# Patient Record
Sex: Female | Born: 1970 | Race: Black or African American | Hispanic: No | Marital: Single | Smoking: Never smoker
Health system: Southern US, Community
[De-identification: ages and names within clinical notes are randomized; demographics above are authoritative.]

## PROBLEM LIST (undated history)

## (undated) DIAGNOSIS — K219 Gastro-esophageal reflux disease without esophagitis: Secondary | ICD-10-CM

## (undated) DIAGNOSIS — N189 Chronic kidney disease, unspecified: Secondary | ICD-10-CM

## (undated) DIAGNOSIS — Z8489 Family history of other specified conditions: Secondary | ICD-10-CM

## (undated) DIAGNOSIS — E1143 Type 2 diabetes mellitus with diabetic autonomic (poly)neuropathy: Secondary | ICD-10-CM

## (undated) DIAGNOSIS — I10 Essential (primary) hypertension: Secondary | ICD-10-CM

## (undated) DIAGNOSIS — E119 Type 2 diabetes mellitus without complications: Secondary | ICD-10-CM

## (undated) DIAGNOSIS — K3184 Gastroparesis: Secondary | ICD-10-CM

## (undated) HISTORY — PX: NO PAST SURGERIES: SHX2092

---

## 2007-04-24 ENCOUNTER — Ambulatory Visit: Payer: Self-pay | Admitting: Cardiology

## 2016-07-22 ENCOUNTER — Ambulatory Visit (HOSPITAL_COMMUNITY)
Admission: RE | Admit: 2016-07-22 | Discharge: 2016-07-22 | Disposition: A | Discharge status: Home / Self Care | Payer: Medicare Other | Source: Ambulatory Visit | Attending: Neurology | Admitting: Neurology

## 2016-07-22 ENCOUNTER — Other Ambulatory Visit (HOSPITAL_COMMUNITY): Payer: Self-pay | Admitting: Neurology

## 2016-07-22 DIAGNOSIS — M1711 Unilateral primary osteoarthritis, right knee: Secondary | ICD-10-CM | POA: Insufficient documentation

## 2016-07-22 DIAGNOSIS — M25561 Pain in right knee: Secondary | ICD-10-CM | POA: Insufficient documentation

## 2016-07-22 DIAGNOSIS — G8929 Other chronic pain: Secondary | ICD-10-CM | POA: Insufficient documentation

## 2017-03-11 ENCOUNTER — Inpatient Hospital Stay (HOSPITAL_COMMUNITY): Payer: Medicare Other

## 2017-03-11 ENCOUNTER — Encounter (HOSPITAL_COMMUNITY): Payer: Self-pay | Admitting: Emergency Medicine

## 2017-03-11 ENCOUNTER — Inpatient Hospital Stay (HOSPITAL_COMMUNITY)
Admission: EM | Admit: 2017-03-11 | Discharge: 2017-03-15 | DRG: 682 | Disposition: A | Discharge status: Home / Self Care | Payer: Medicare Other | Attending: Internal Medicine | Admitting: Internal Medicine

## 2017-03-11 ENCOUNTER — Emergency Department (HOSPITAL_COMMUNITY): Payer: Medicare Other

## 2017-03-11 DIAGNOSIS — R0602 Shortness of breath: Secondary | ICD-10-CM | POA: Diagnosis not present

## 2017-03-11 DIAGNOSIS — E1140 Type 2 diabetes mellitus with diabetic neuropathy, unspecified: Secondary | ICD-10-CM | POA: Diagnosis present

## 2017-03-11 DIAGNOSIS — N189 Chronic kidney disease, unspecified: Secondary | ICD-10-CM | POA: Diagnosis present

## 2017-03-11 DIAGNOSIS — E872 Acidosis: Secondary | ICD-10-CM | POA: Diagnosis present

## 2017-03-11 DIAGNOSIS — E669 Obesity, unspecified: Secondary | ICD-10-CM | POA: Diagnosis present

## 2017-03-11 DIAGNOSIS — I129 Hypertensive chronic kidney disease with stage 1 through stage 4 chronic kidney disease, or unspecified chronic kidney disease: Secondary | ICD-10-CM | POA: Diagnosis present

## 2017-03-11 DIAGNOSIS — D638 Anemia in other chronic diseases classified elsewhere: Secondary | ICD-10-CM | POA: Diagnosis present

## 2017-03-11 DIAGNOSIS — N179 Acute kidney failure, unspecified: Secondary | ICD-10-CM | POA: Diagnosis present

## 2017-03-11 DIAGNOSIS — D649 Anemia, unspecified: Secondary | ICD-10-CM | POA: Diagnosis present

## 2017-03-11 DIAGNOSIS — N170 Acute kidney failure with tubular necrosis: Principal | ICD-10-CM | POA: Diagnosis present

## 2017-03-11 DIAGNOSIS — E1122 Type 2 diabetes mellitus with diabetic chronic kidney disease: Secondary | ICD-10-CM | POA: Diagnosis present

## 2017-03-11 DIAGNOSIS — E8770 Fluid overload, unspecified: Secondary | ICD-10-CM | POA: Diagnosis present

## 2017-03-11 DIAGNOSIS — J9811 Atelectasis: Secondary | ICD-10-CM | POA: Diagnosis present

## 2017-03-11 DIAGNOSIS — I360 Nonrheumatic tricuspid (valve) stenosis: Secondary | ICD-10-CM | POA: Diagnosis not present

## 2017-03-11 DIAGNOSIS — R0600 Dyspnea, unspecified: Secondary | ICD-10-CM | POA: Diagnosis present

## 2017-03-11 DIAGNOSIS — M109 Gout, unspecified: Secondary | ICD-10-CM | POA: Diagnosis present

## 2017-03-11 DIAGNOSIS — Z6834 Body mass index (BMI) 34.0-34.9, adult: Secondary | ICD-10-CM | POA: Diagnosis not present

## 2017-03-11 DIAGNOSIS — R52 Pain, unspecified: Secondary | ICD-10-CM

## 2017-03-11 DIAGNOSIS — J9600 Acute respiratory failure, unspecified whether with hypoxia or hypercapnia: Secondary | ICD-10-CM | POA: Diagnosis present

## 2017-03-11 DIAGNOSIS — R911 Solitary pulmonary nodule: Secondary | ICD-10-CM | POA: Diagnosis not present

## 2017-03-11 HISTORY — DX: Essential (primary) hypertension: I10

## 2017-03-11 LAB — BASIC METABOLIC PANEL
Anion gap: 11 (ref 5–15)
BUN: 61 mg/dL — ABNORMAL HIGH (ref 6–20)
CALCIUM: 9.9 mg/dL (ref 8.9–10.3)
CO2: 16 mmol/L — ABNORMAL LOW (ref 22–32)
Chloride: 112 mmol/L — ABNORMAL HIGH (ref 101–111)
Creatinine, Ser: 3.81 mg/dL — ABNORMAL HIGH (ref 0.44–1.00)
GFR, EST AFRICAN AMERICAN: 15 mL/min — AB (ref >60)
GFR, EST NON AFRICAN AMERICAN: 13 mL/min — AB (ref >60)
GLUCOSE: 112 mg/dL — AB (ref 65–99)
Potassium: 4.1 mmol/L (ref 3.5–5.1)
SODIUM: 139 mmol/L (ref 135–145)

## 2017-03-11 LAB — URINALYSIS, ROUTINE W REFLEX MICROSCOPIC
Bilirubin Urine: NEGATIVE
GLUCOSE, UA: NEGATIVE mg/dL
Ketones, ur: NEGATIVE mg/dL
Leukocytes, UA: NEGATIVE
Nitrite: NEGATIVE
PH: 5 (ref 5.0–8.0)
Protein, ur: 100 mg/dL — AB
SPECIFIC GRAVITY, URINE: 1.011 (ref 1.005–1.030)

## 2017-03-11 LAB — CBC
HCT: 24.9 % — ABNORMAL LOW (ref 36.0–46.0)
Hemoglobin: 8.3 g/dL — ABNORMAL LOW (ref 12.0–15.0)
MCH: 29.1 pg (ref 26.0–34.0)
MCHC: 33.3 g/dL (ref 30.0–36.0)
MCV: 87.4 fL (ref 78.0–100.0)
PLATELETS: 297 10*3/uL (ref 150–400)
RBC: 2.85 MIL/uL — ABNORMAL LOW (ref 3.87–5.11)
RDW: 15 % (ref 11.5–15.5)
WBC: 15.8 10*3/uL — AB (ref 4.0–10.5)

## 2017-03-11 LAB — CREATININE, URINE, RANDOM: CREATININE, URINE: 60.96 mg/dL

## 2017-03-11 LAB — SODIUM, URINE, RANDOM: Sodium, Ur: 79 mmol/L

## 2017-03-11 LAB — BRAIN NATRIURETIC PEPTIDE: B NATRIURETIC PEPTIDE 5: 135.6 pg/mL — AB (ref 0.0–100.0)

## 2017-03-11 LAB — D-DIMER, QUANTITATIVE (NOT AT ARMC): D DIMER QUANT: 2.51 ug{FEU}/mL — AB (ref 0.00–0.50)

## 2017-03-11 LAB — LIPASE, BLOOD: Lipase: 108 U/L — ABNORMAL HIGH (ref 11–51)

## 2017-03-11 LAB — MRSA PCR SCREENING: MRSA by PCR: NEGATIVE

## 2017-03-11 LAB — GLUCOSE, CAPILLARY: GLUCOSE-CAPILLARY: 133 mg/dL — AB (ref 65–99)

## 2017-03-11 MED ORDER — HEPARIN SODIUM (PORCINE) 5000 UNIT/ML IJ SOLN
5000.0000 [IU] | Freq: Three times a day (TID) | INTRAMUSCULAR | Status: DC
  Administered 2017-03-11 – 2017-03-12 (×2): 5000 [IU] via SUBCUTANEOUS
  Filled 2017-03-11 (×3): qty 1

## 2017-03-11 MED ORDER — ONDANSETRON HCL 4 MG PO TABS: 4.0000 mg | ORAL_TABLET | Freq: Four times a day (QID) | ORAL | Status: DC | PRN

## 2017-03-11 MED ORDER — FLUTICASONE PROPIONATE 50 MCG/ACT NA SUSP
1.0000 | Freq: Every day | NASAL | Status: DC | PRN
Start: 2017-03-11 — End: 2017-03-15

## 2017-03-11 MED ORDER — AMLODIPINE BESYLATE 10 MG PO TABS
10.0000 mg | ORAL_TABLET | Freq: Every day | ORAL | Status: DC
  Administered 2017-03-12 – 2017-03-15 (×4): 10 mg via ORAL
  Filled 2017-03-11 (×4): qty 1

## 2017-03-11 MED ORDER — LEVALBUTEROL HCL 0.63 MG/3ML IN NEBU
0.6300 mg | INHALATION_SOLUTION | Freq: Four times a day (QID) | RESPIRATORY_TRACT | Status: DC
  Administered 2017-03-11 – 2017-03-12 (×5): 0.63 mg via RESPIRATORY_TRACT
  Filled 2017-03-11 (×4): qty 3

## 2017-03-11 MED ORDER — ACETAMINOPHEN 325 MG PO TABS
650.0000 mg | ORAL_TABLET | Freq: Four times a day (QID) | ORAL | Status: DC | PRN
  Administered 2017-03-11 – 2017-03-12 (×3): 650 mg via ORAL
  Filled 2017-03-11 (×4): qty 2

## 2017-03-11 MED ORDER — LEVALBUTEROL HCL 0.63 MG/3ML IN NEBU
0.6300 mg | INHALATION_SOLUTION | Freq: Four times a day (QID) | RESPIRATORY_TRACT | Status: DC | PRN
  Filled 2017-03-11: qty 3

## 2017-03-11 MED ORDER — FUROSEMIDE 10 MG/ML IJ SOLN: 20.0000 mg | Freq: Once | INTRAMUSCULAR | Status: DC

## 2017-03-11 MED ORDER — METOPROLOL TARTRATE 25 MG PO TABS
25.0000 mg | ORAL_TABLET | Freq: Two times a day (BID) | ORAL | Status: DC
  Administered 2017-03-11 – 2017-03-15 (×8): 25 mg via ORAL
  Filled 2017-03-11 (×8): qty 1

## 2017-03-11 MED ORDER — INSULIN ASPART 100 UNIT/ML ~~LOC~~ SOLN
0.0000 [IU] | Freq: Three times a day (TID) | SUBCUTANEOUS | Status: DC
  Administered 2017-03-12 – 2017-03-14 (×2): 3 [IU] via SUBCUTANEOUS
  Administered 2017-03-15: 2 [IU] via SUBCUTANEOUS

## 2017-03-11 MED ORDER — FUROSEMIDE 10 MG/ML IJ SOLN: 40.0000 mg | Freq: Once | INTRAMUSCULAR | Status: DC

## 2017-03-11 MED ORDER — ONDANSETRON HCL 4 MG/2ML IJ SOLN: 4.0000 mg | Freq: Four times a day (QID) | INTRAMUSCULAR | Status: DC | PRN

## 2017-03-11 MED ORDER — HYDROCODONE-ACETAMINOPHEN 5-325 MG PO TABS
1.0000 | ORAL_TABLET | Freq: Four times a day (QID) | ORAL | Status: DC | PRN
  Administered 2017-03-13: 1 via ORAL
  Administered 2017-03-14 – 2017-03-15 (×2): 2 via ORAL
  Filled 2017-03-11: qty 2
  Filled 2017-03-11: qty 1
  Filled 2017-03-11: qty 2

## 2017-03-11 NOTE — ED Notes (Signed)
Spoke with Dr Antionette Charpyd, order for PICC line  Placement

## 2017-03-11 NOTE — ED Notes (Signed)
IV team at bedside 

## 2017-03-11 NOTE — H&P (Addendum)
History and Physical    Pamela Goodwin MVH:[id number]:[id number] DOB: 1971-01-03 DOA: 03/11/2017  Referring MD/NP/PA: Dr. Aileen Goodwin  PCP: Pamela Goodwin, Ashish, MD   Patient coming from: home  Chief Complaint: dyspnea   HPI: Pamela Goodwin is a 46 y.o. female with known hypertension, diabetes, [facility] after being hospitalized for pancreatitis. Please note that patient does not know details of the exact cause of pancreatitis and is not reliable historian, always referring to her family at bedside to provide information. Patient presented to [facility] emergency department with main concern of progressively worsening dyspnea since the discharge. Patient says that she was short of breath in the hospital but thought it will eventually resolve however, her symptoms got worse and she started to experience dyspnea with exertion as well as dyspnea at rest. Patient also reports progressively worsening lower extremity swelling and weight gain of more than 20 pounds. She says that her baseline weight is around 150 pounds. Patient denies fevers and chills, no specific abdominal concerns. Patient denies chest pain. Please note that most of the history is provided by father at bedside.  ED Course: Patient noted to be tachypnea with respiratory rate in 30s however, oxygen saturation stable above 93% on room air. Blood work notable for WBC 15.8, hemoglobin 8.3, bicarbonate 16, creatinine 3.81. Chest x-ray notable for low lung volumes, questionable atelectasis, no evidence of pneumonia or fulminant edema.   Review of Systems:  Constitutional: Negative for appetite change and fatigue.  HENT: Negative for ear pain, nosebleeds, congestion, facial swelling, rhinorrhea, neck pain, neck stiffness and ear discharge.   Eyes: Negative for pain, discharge, redness, itching and visual disturbance.  Respiratory: Negative for cough, choking, chest tightness Cardiovascular: Negative for chest pain,  palpitations Gastrointestinal: Negative for abdominal distention.  Genitourinary: Negative for dysuria, urgency, frequency, hematuria, flank pain, decreased urine volume Musculoskeletal: Negative for back pain, joint swelling, arthralgias and gait problem.  Neurological: Negative for dizziness, tremors, seizures, syncope, facial asymmetry, speech difficulty, weakness, light-headedness, numbness and headaches.  Hematological: Negative for adenopathy. Does not bruise/bleed easily.  Psychiatric/Behavioral: Negative for hallucinations, behavioral problems, confusion, dysphoric mood, decreased concentration and agitation.   Past Medical History:  Diagnosis Date  . Diabetes mellitus without complication (HCC)   . Hypertension    Social history:  reports that she has never smoked. She has never used smokeless tobacco. She reports that she does not drink alcohol or use drugs.  Allergies  Allergen Reactions  . Shellfish Allergy     Family history of heart disease in father who had cardiac bypass  Prior to Admission medications   Not on File    Physical Exam: Vitals:   03/11/17 1406 03/11/17 1603 03/11/17 1700 03/11/17 1730  BP: (!) 144/89 (!) 148/97 115/81 113/79  Pulse: (!) 101 (!) 105 (!) 101 95  Resp: 18 16 17  (!) 21  Temp: 98.1 F (36.7 C)     TempSrc: Oral     SpO2: 97% 99% 97% 99%  Weight: 73 kg (161 lb)     Height: 5' (1.524 m)       Constitutional: In mild distress due to tachypnea  Vitals:   03/11/17 1406 03/11/17 1603 03/11/17 1700 03/11/17 1730  BP: (!) 144/89 (!) 148/97 115/81 113/79  Pulse: (!) 101 (!) 105 (!) 101 95  Resp: 18 16 17  (!) 21  Temp: 98.1 F (36.7 C)     TempSrc: Oral     SpO2: 97% 99% 97% 99%  Weight: 73 kg (  161 lb)     Height: 5' (1.524 m)      Eyes: PERRL, lids and conjunctivae normal ENMT: Mucous membranes are moist. Posterior pharynx clear of any exudate or lesions.Normal dentition.  Neck: normal, supple, no masses, no  thyromegaly Respiratory: Tachypnea With respiratory rate in mid 20s, poor air movement bilaterally with crackles at bases  Cardiovascular:  Tachycardic, no murmurs auscultated Abdomen: no tenderness, no masses palpated. No hepatosplenomegaly. Bowel sounds positive.  Musculoskeletal: no clubbing / cyanosis. No joint deformity upper and lower extremities. Good ROM, no contractures. Normal muscle tone. +1 bilateral pitting edema  Skin: no rashes, lesions, ulcers. No induration Neurologic: CN 2-12 grossly intact. Sensation intact, DTR normal. Strength 5/5 in all 4.  Psychiatric: Alert and oriented x 3. Normal mood.   Labs on Admission: I have personally reviewed following labs and imaging studies  CBC:  Recent Labs Lab 03/11/17 1425  WBC 15.8*  HGB 8.3*  HCT 24.9*  MCV 87.4  PLT 297   Basic Metabolic Panel:  Recent Labs Lab 03/11/17 1425  NA 139  K 4.1  CL 112*  CO2 16*  GLUCOSE 112*  BUN 61*  CREATININE 3.81*  CALCIUM 9.9   Radiological Exams on Admission: Dg Chest 2 View  Result Date: 03/11/2017 CLINICAL DATA:  Acute shortness of breath. EXAM: CHEST  2 VIEW COMPARISON:  None. FINDINGS: This is a low volume film. Upper limits normal heart size noted. Bibasilar opacities are identified, favor atelectasis over airspace disease. There is no evidence of pneumothorax or pleural effusion. No bony abnormalities are present. IMPRESSION: Low volume film with bibasilar opacities -favor atelectasis. Upper limits normal heart size. Electronically Signed   By: Harmon Pier M.D.   On: 03/11/2017 15:50    EKG: Pending   Assessment/Plan Active Problems: Acute respiratory failure - Unclear etiology suspect related to volume overload as patient has gotten fluids during her recent hospital stay for pancreatitis - She has R the gotten Lasix in the emergency department and will hold off for now - I will obtain CT chest for clear evaluation - Also obtain d-dimer and if positive, will likely  need VQ scan to rule out PE - May need echocardiogram if one not done recently - Asked staff to obtain medical records to review recent hospital stay  Acute kidney injury, metabolic acidosis - Unknowing if patient has chronic component as well - Her father tells me her kidney function was stable from what he knows, per ER doctor recent creatinine on discharge was around 2 - We'll hold off on IV fluids - hold Metformin - Check daily weights, strict intake and urine output - Repeat BMP in the morning - Renal ultrasound also requested - Check urine sodium and urine creatinine  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  DVT prophylaxis:  heparin subcutaneous  Code Status: full Family Communication: Pt  and father updated at bedside Disposition Plan: will likely go home once medically stable  Consults called:  none  Admission status:  inpatient   Pamela Presto MD [facility] Pager [phone]  If 7PM-7AM, please contact  night-coverage [email or web address] Password [email or web address]  03/11/2017, 5:53 PM

## 2017-03-11 NOTE — ED Notes (Signed)
Patient given ice chips per EDP approval  

## 2017-03-11 NOTE — ED Triage Notes (Addendum)
Per family was at [facility] [place] and discharged yesterday with pancreatitis.  C/O bil lower ext swelling and pain in legs.  Also c/o SOB with any exertion.  SOB noted with talking in triage.  Family reports she was given lasix while there to get fluid off.

## 2017-03-11 NOTE — ED Notes (Signed)
QNS still need blue top

## 2017-03-11 NOTE — ED Provider Notes (Signed)
[facility]-EMERGENCY DEPT Provider Note   CSN: [id number] Arrival date & time: 03/11/17  1356     History   Chief Complaint Chief Complaint  Patient presents with  . Leg Swelling    HPI Pamela Goodwin is a 46 y.o. female.  Patient complains of weakness. Some shortness of breath. She was just [place] the [place] in [place] with [place].   The history is provided by the patient. No language interpreter was used.  Weakness  Primary symptoms include no focal weakness. This is a new problem. The current episode started 2 days ago. The problem has not changed since onset.There was no focality noted. There has been no fever. Associated symptoms include shortness of breath. Pertinent negatives include no chest pain and no headaches. Associated medical issues do not include trauma.    Past Medical History:  Diagnosis Date  . Diabetes mellitus without complication (HCC)   . Hypertension     Patient Active Problem List   Diagnosis Date Noted  . Acute renal failure (ARF) (HCC) 03/11/2017    History reviewed. No pertinent surgical history.  OB History    No data available       Home Medications    Prior to Admission medications   Not on File    Family History No family history on file.  Social History Social History  Substance Use Topics  . Smoking status: Never Smoker  . Smokeless tobacco: Never Used  . Alcohol use No     Allergies   Shellfish allergy   Review of Systems Review of Systems  Constitutional: Negative for appetite change and fatigue.  HENT: Negative for congestion, ear discharge and sinus pressure.   Eyes: Negative for discharge.  Respiratory: Positive for shortness of breath. Negative for cough.   Cardiovascular: Negative for chest pain.  Gastrointestinal: Negative for abdominal pain and diarrhea.  Genitourinary: Negative for frequency and hematuria.  Musculoskeletal: Negative for back pain.  Skin: Negative for rash.    Neurological: Positive for weakness. Negative for focal weakness, seizures and headaches.  Psychiatric/Behavioral: Negative for hallucinations.     Physical Exam Updated Vital Signs BP 113/79   Pulse 95   Temp 98.1 F (36.7 C) (Oral)   Resp (!) 21   Ht 5' (1.524 m)   Wt 73 kg (161 lb)   LMP 02/22/2017 (Exact Date)   SpO2 99%   BMI 31.44 kg/m   Physical Exam  Constitutional: She is oriented to person, place, and time. She appears well-developed.  HENT:  Head: Normocephalic.  Eyes: Conjunctivae and EOM are normal. No scleral icterus.  Neck: Neck supple. No thyromegaly present.  Cardiovascular: Normal rate and regular rhythm.  Exam reveals no gallop and no friction rub.   No murmur heard. Pulmonary/Chest: No stridor. She has no wheezes. She has no rales. She exhibits no tenderness.  Abdominal: She exhibits no distension. There is no tenderness. There is no rebound.  Musculoskeletal: Normal range of motion. She exhibits no edema.  1+ edema in legs  Lymphadenopathy:    She has no cervical adenopathy.  Neurological: She is oriented to person, place, and time. She exhibits normal muscle tone. Coordination normal.  Skin: No rash noted. No erythema.  Psychiatric: She has a normal mood and affect. Her behavior is normal.     ED Treatments / Results  Labs (all labs ordered are listed, but only abnormal results are displayed) Labs Reviewed  CBC - Abnormal; Notable for the following:  Result Value   WBC 15.8 (*)    RBC 2.85 (*)    Hemoglobin 8.3 (*)    HCT 24.9 (*)    All other components within normal limits  BASIC METABOLIC PANEL - Abnormal; Notable for the following:    Chloride 112 (*)    CO2 16 (*)    Glucose, Bld 112 (*)    BUN 61 (*)    Creatinine, Ser 3.81 (*)    GFR calc non Af Amer 13 (*)    GFR calc Af Amer 15 (*)    All other components within normal limits  BRAIN NATRIURETIC PEPTIDE - Abnormal; Notable for the following:    B Natriuretic Peptide  135.6 (*)    All other components within normal limits    EKG  EKG Interpretation  Date/Time:  Friday March 11 2017 14:08:14 EDT Ventricular Rate:  104 PR Interval:  174 QRS Duration: 70 QT Interval:  338 QTC Calculation: 444 R Axis:   -27 Text Interpretation:  Sinus tachycardia Otherwise normal ECG Confirmed by Bethann Berkshire [phone]) on 03/11/2017 5:31:32 PM       Radiology Dg Chest 2 View  Result Date: 03/11/2017 CLINICAL DATA:  Acute shortness of breath. EXAM: CHEST  2 VIEW COMPARISON:  None. FINDINGS: This is a low volume film. Upper limits normal heart size noted. Bibasilar opacities are identified, favor atelectasis over airspace disease. There is no evidence of pneumothorax or pleural effusion. No bony abnormalities are present. IMPRESSION: Low volume film with bibasilar opacities -favor atelectasis. Upper limits normal heart size. Electronically Signed   By: Harmon Pier M.D.   On: 03/11/2017 15:50    Procedures Procedures (including critical care time)  Medications Ordered in ED Medications - No data to display   Initial Impression / Assessment and Plan / ED Course  I have reviewed the triage vital signs and the nursing notes.  Pertinent labs & imaging results that were available during my care of the patient were reviewed by me and considered in my medical decision making (see chart for details).     Patient with AK. Creatinine 3.8 it was 2.8 a few days ago. Patient will be admitted to medicine  Final Clinical Impressions(s) / ED Diagnoses   Final diagnoses:  AKI (acute kidney injury) [facility])    New Prescriptions New Prescriptions   No medications on file     Bethann Berkshire, MD 03/11/17 1757

## 2017-03-11 NOTE — ED Notes (Signed)
Returned from CT, getting blood drawn and US to come at bedside.

## 2017-03-11 NOTE — ED Notes (Signed)
Hospitalist at bedside 

## 2017-03-12 ENCOUNTER — Encounter (HOSPITAL_COMMUNITY): Payer: Self-pay | Admitting: Pulmonary Disease

## 2017-03-12 DIAGNOSIS — R0602 Shortness of breath: Secondary | ICD-10-CM

## 2017-03-12 DIAGNOSIS — R911 Solitary pulmonary nodule: Secondary | ICD-10-CM

## 2017-03-12 LAB — COMPREHENSIVE METABOLIC PANEL
ALK PHOS: 34 U/L — AB (ref 38–126)
ALT: 15 U/L (ref 14–54)
ANION GAP: 9 (ref 5–15)
AST: 19 U/L (ref 15–41)
Albumin: 2.6 g/dL — ABNORMAL LOW (ref 3.5–5.0)
BUN: 59 mg/dL — ABNORMAL HIGH (ref 6–20)
CALCIUM: 10.1 mg/dL (ref 8.9–10.3)
CO2: 18 mmol/L — AB (ref 22–32)
Chloride: 113 mmol/L — ABNORMAL HIGH (ref 101–111)
Creatinine, Ser: 3.9 mg/dL — ABNORMAL HIGH (ref 0.44–1.00)
GFR calc non Af Amer: 13 mL/min — ABNORMAL LOW (ref >60)
GFR, EST AFRICAN AMERICAN: 15 mL/min — AB (ref >60)
Glucose, Bld: 184 mg/dL — ABNORMAL HIGH (ref 65–99)
Potassium: 4.1 mmol/L (ref 3.5–5.1)
SODIUM: 140 mmol/L (ref 135–145)
TOTAL PROTEIN: 6.2 g/dL — AB (ref 6.5–8.1)
Total Bilirubin: 0.4 mg/dL (ref 0.3–1.2)

## 2017-03-12 LAB — TROPONIN I
Troponin I: <0.03 ng/mL (ref <0.03)
Troponin I: <0.03 ng/mL (ref <0.03)

## 2017-03-12 LAB — GLUCOSE, CAPILLARY
GLUCOSE-CAPILLARY: 120 mg/dL — AB (ref 65–99)
GLUCOSE-CAPILLARY: 207 mg/dL — AB (ref 65–99)
Glucose-Capillary: 96 mg/dL (ref 65–99)
Glucose-Capillary: 133 mg/dL — ABNORMAL HIGH (ref 65–99)

## 2017-03-12 LAB — CBC
HCT: 22.3 % — ABNORMAL LOW (ref 36.0–46.0)
HEMATOCRIT: 23.8 % — AB (ref 36.0–46.0)
HEMOGLOBIN: 7.7 g/dL — AB (ref 12.0–15.0)
HEMOGLOBIN: 7.3 g/dL — AB (ref 12.0–15.0)
MCH: 28 pg (ref 26.0–34.0)
MCH: 28.5 pg (ref 26.0–34.0)
MCHC: 32.4 g/dL (ref 30.0–36.0)
MCHC: 32.7 g/dL (ref 30.0–36.0)
MCV: 86.5 fL (ref 78.0–100.0)
MCV: 87.1 fL (ref 78.0–100.0)
Platelets: 262 10*3/uL (ref 150–400)
Platelets: 244 10*3/uL (ref 150–400)
RBC: 2.75 MIL/uL — ABNORMAL LOW (ref 3.87–5.11)
RBC: 2.56 MIL/uL — AB (ref 3.87–5.11)
RDW: 15.2 % (ref 11.5–15.5)
RDW: 15.2 % (ref 11.5–15.5)
WBC: 13.7 10*3/uL — AB (ref 4.0–10.5)
WBC: 12 10*3/uL — AB (ref 4.0–10.5)

## 2017-03-12 LAB — CREATININE, SERUM
Creatinine, Ser: 3.92 mg/dL — ABNORMAL HIGH (ref 0.44–1.00)
GFR calc non Af Amer: 13 mL/min — ABNORMAL LOW (ref >60)
GFR, EST AFRICAN AMERICAN: 15 mL/min — AB (ref >60)

## 2017-03-12 LAB — HIV ANTIBODY (ROUTINE TESTING W REFLEX): HIV Screen 4th Generation wRfx: NONREACTIVE

## 2017-03-12 LAB — HEMOGLOBIN A1C
Hgb A1c MFr Bld: 6.4 % — ABNORMAL HIGH (ref 4.8–5.6)
Mean Plasma Glucose: 137 mg/dL

## 2017-03-12 LAB — LIPASE, BLOOD: LIPASE: 109 U/L — AB (ref 11–51)

## 2017-03-12 LAB — TSH: TSH: 3.32 u[IU]/mL (ref 0.350–4.500)

## 2017-03-12 MED ORDER — HEPARIN SODIUM (PORCINE) 5000 UNIT/ML IJ SOLN
5000.0000 [IU] | Freq: Three times a day (TID) | INTRAMUSCULAR | Status: DC
  Administered 2017-03-12 – 2017-03-15 (×7): 5000 [IU] via SUBCUTANEOUS
  Filled 2017-03-12 (×6): qty 1

## 2017-03-12 MED ORDER — LEVALBUTEROL HCL 0.63 MG/3ML IN NEBU
0.6300 mg | INHALATION_SOLUTION | Freq: Two times a day (BID) | RESPIRATORY_TRACT | Status: DC
  Administered 2017-03-13 – 2017-03-15 (×5): 0.63 mg via RESPIRATORY_TRACT
  Filled 2017-03-12 (×5): qty 3

## 2017-03-12 NOTE — Consult Note (Signed)
Name: Pamela Goodwin MRN: [id number] DOB: 11/26/1970    ADMISSION DATE:  03/11/2017 CONSULTATION DATE:  03/12/17  REFERRING MD :  Dr. Izola Price / [facility]   CHIEF COMPLAINT:  Pulmonary Nodule   HISTORY OF PRESENT ILLNESS:  46 y/o F who presented to [facility] on 7/6 with complaints of lower extremity swelling / pain and shortness of breath with exertion.    The patient was discharged from [facility] on 7/5 for possible acute pancreatitis (pt unable to give details regarding pancreatitis).  Family reports that she had ~ 20 lb weight gain with swelling after discharge (reports baseline weight ~ 150 lbs).  Initial evaluation notable for tachypnea, elevated sr cr (3.81, BNP 135.6, negative troponin, WBC 15.8, Hgb 8.3 and platelets 297. Initial CXR demonstrated low lung volumes with bibasilar atelectasis.  The patient was admitted per [facility] for evaluation.  Renal US assessed which demonstrated increased renal echogenicity consistent with chronic medical renal disease.  CT of the chest evaluated in the setting of SOB which showed abnormal lung nodules in the bases with scattered atelectasis, surrounding GGO and mosaic attenuation, potential infrahilar adenopathy bilaterally (difficult to separate due to no contrast and near vessels), small pericardial effusion, low lung volumes.  She was given lasix 20 mg for possible volume overload and is net negative 710 ml since admit (?accuracy).    PCCM consulted for evaluation of dyspnea and lung nodules.    PMH:  Disabled, prior TIA's, prior seizures, DM on metformin and HTN  PAST MEDICAL HISTORY :   has a past medical history of Diabetes mellitus without complication (HCC) and Hypertension.   has no past surgical history on file.  Prior to Admission medications   Medication Sig Start Date End Date Taking? Authorizing Provider  amLODipine (NORVASC) 10 MG tablet Take 10 mg by mouth daily.   Yes [provider]  Cholecalciferol (VITAMIN D3) 50000 units CAPS  Take 50,000 Units by mouth every 7 (seven) days.   Yes [provider]  Dextromethorphan-Guaifenesin (ROBITUSSIN SUGAR FREE) 10-100 MG/5ML liquid Take 5 mLs by mouth every 12 (twelve) hours as needed (for cough/congestion).   Yes [provider]  fluticasone (FLONASE) 50 MCG/ACT nasal spray Place 1 spray into both nostrils daily as needed for allergies or rhinitis.   Yes [provider]  gabapentin (NEURONTIN) 300 MG capsule Take 300 mg by mouth 3 (three) times daily.   Yes [provider]  levofloxacin (LEVAQUIN) 250 MG tablet Take 250 mg by mouth daily. For 10 days 03/10/17  Yes [provider]  metFORMIN (GLUCOPHAGE) 500 MG tablet Take 500 mg by mouth 2 (two) times daily.   Yes [provider]  methocarbamol (ROBAXIN) 500 MG tablet Take 500 mg by mouth every 8 (eight) hours as needed for muscle spasms.   Yes [provider]  metoprolol tartrate (LOPRESSOR) 25 MG tablet Take 25 mg by mouth 2 (two) times daily.   Yes [provider]  Olopatadine HCl (PAZEO) 0.7 % SOLN Place 1 drop into both eyes daily as needed (for irritation).   Yes [provider]    Allergies  Allergen Reactions  . Shellfish Allergy Anaphylaxis    Angioedema also  . Hydralazine Other (See Comments)    Patient "zoned out" and caused extreme lethargy (??)    FAMILY HISTORY:  family history is not on file.  SOCIAL HISTORY:  reports that she has never smoked. She has never used smokeless tobacco. She reports that she does not drink  alcohol or use drugs.  REVIEW OF SYSTEMS:  POSITIVES IN BOLD Constitutional: Negative for fever, chills, weight loss, malaise/fatigue and diaphoresis.  HENT: Negative for hearing loss, ear pain, nosebleeds, congestion, sore throat, neck pain, tinnitus and ear discharge.   Eyes: Negative for blurred vision, double vision, photophobia, pain, discharge and redness.  Respiratory: Negative for cough, hemoptysis, sputum  production, shortness of breath, wheezing and stridor.   Cardiovascular: Negative for chest pain, palpitations, orthopnea, claudication, BLE leg swelling and PND.  Gastrointestinal: Negative for heartburn, nausea, vomiting, abdominal pain, diarrhea, constipation, blood in stool and melena.  Genitourinary: Negative for dysuria, urgency, frequency, hematuria and flank pain.  Musculoskeletal: Negative for myalgias, back pain, joint pain and falls.  Skin: Negative for itching and rash.  Neurological: Negative for dizziness, tingling, tremors, sensory change, speech change, focal weakness, seizures, loss of consciousness, weakness and headaches.  Endo/Heme/Allergies: Negative for environmental allergies and polydipsia. Does not bruise/bleed easily.  SUBJECTIVE:   VITAL SIGNS: Temp:  [97.5 F (36.4 C)-98.5 F (36.9 C)] 98.5 F (36.9 C) (07/07 1302) Pulse Rate:  [91-110] 100 (07/07 1302) Resp:  [13-28] 18 (07/07 1302) BP: (113-148)/(79-107) 132/107 (07/07 1302) SpO2:  [96 %-99 %] 98 % (07/07 1302) FiO2 (%):  [21 %] 21 % (07/06 2132) Weight:  [161 lb (73 kg)-175 lb 0.7 oz (79.4 kg)] 175 lb 0.7 oz (79.4 kg) (07/07 0430)  PHYSICAL EXAMINATION: General: adult female in NAD lying in bed HEENT: MM pink/moist, no jvd PSY: occasional verbal outbursts, anxious at times  Neuro: Awake, alert, oriented but not reliable for medical information  CV: s1s2 rrr, no m/r/g PULM: even/non-labored, lungs bilaterally with bibasilar faint crackles  OZ:HYQMGI:soft, non-tender, bsx4 active  Extremities: warm/dry, 1+ pitting BLE edema  Skin: no rashes or lesions    Recent Labs Lab 03/11/17 1425 03/12/17 0100 03/12/17 1013  NA 139  --  140  K 4.1  --  4.1  CL 112*  --  113*  CO2 16*  --  18*  BUN 61*  --  59*  CREATININE 3.81* 3.92* 3.90*  GLUCOSE 112*  --  184*     Recent Labs Lab 03/11/17 1425 03/12/17 0100 03/12/17 1013  HGB 8.3* 7.7* 7.3*  HCT 24.9* 23.8* 22.3*  WBC 15.8* 13.7* 12.0*  PLT 297  262 244    Dg Chest 2 View  Result Date: 03/11/2017 CLINICAL DATA:  Acute shortness of breath. EXAM: CHEST  2 VIEW COMPARISON:  None. FINDINGS: This is a low volume film. Upper limits normal heart size noted. Bibasilar opacities are identified, favor atelectasis over airspace disease. There is no evidence of pneumothorax or pleural effusion. No bony abnormalities are present. IMPRESSION: Low volume film with bibasilar opacities -favor atelectasis. Upper limits normal heart size. Electronically Signed   By: Harmon PierJeffrey  Hu M.D.   On: 03/11/2017 15:50   Ct Chest Wo Contrast  Result Date: 03/11/2017 CLINICAL DATA:  Dyspnea and shortness breath intermittently for the last few days. EXAM: CT CHEST WITHOUT CONTRAST TECHNIQUE: Multidetector CT imaging of the chest was performed following the standard protocol without IV contrast. COMPARISON:  03/11/2017 FINDINGS: Body habitus reduces diagnostic sensitivity and specificity. Cardiovascular: Mild cardiomegaly. Mild atherosclerotic calcification in the left anterior descending coronary artery. Trace pericardial effusion. Mediastinum/Nodes: Poor definition of fat planes around the trachea and mainstem bronchi of uncertain significance. No overt pathologic paratracheal or AP window adenopathy. Fullness of both hila, difficult to exclude hilar or infrahilar adenopathy. Lungs/Pleura: Abnormal nodularity in the lung bases. For example,  there is a 1.9 by 1.2 cm nodule along the right lower lobe side of the right major fissure on image 62/4. A subpleural right lower lobe nodule measuring 0.6 by 0.6 cm is present on image 66/4. If anteriorly in the left lower lobe, a 1.5 by 1.3 cm nodular density on image 65/4 is obscured by surrounding bandlike atelectasis. Similarly there is additional nodularity scattered along the left inferior pulmonary ligament and in the subpleural locations of the left lower lobe. This process seems to favor the lower lobes. Low lung volumes are present and  there is mosaic attenuation in the lower lobes. With regard to the airways, I do not see peribronchovascular nodularity along the more peripheral airways. Upper Abdomen: Unremarkable Musculoskeletal: Unremarkable IMPRESSION: 1. Abnormal lung nodules in the lung bases with scattered atelectasis. Surrounding ground-glass opacities and mosaic attenuation also favoring the lung bases. Possibilities may include sarcoidosis, fungal disease, or atypical infectious process. Some of these nodules are along pleural surfaces, but I do not see definite paratracheal adenopathy or distal peribronchovascular nodularity to further suggest sarcoidosis. There is potentially infrahilar adenopathy bilaterally, although this is difficult to separate out from the vessels given the lack of IV contrast. Follow up imaging to ensure resolution is recommended to exclude the unlikely possibility of malignancy. 2. Athero left anterior descending coronary atherosclerosis and mild cardiomegaly. 3. Small pericardial effusion. 4. Low lung volumes. Electronically Signed   By: Gaylyn Rong M.D.   On: 03/11/2017 19:29   US Renal  Result Date: 03/11/2017 CLINICAL DATA:  Acute renal failure. EXAM: RENAL / URINARY TRACT ULTRASOUND COMPLETE COMPARISON:  None. FINDINGS: Right Kidney: Length: 9 cm. Increased renal cortical echogenicity. No hydronephrosis visualized. No visualized mass. Left Kidney: Length: 9.5 cm. Increased renal cortical echogenicity. No hydronephrosis visualized. No visualized mass. Bladder: Appears normal for degree of bladder distention. IMPRESSION: Increased renal echogenicity consistent with chronic medical renal disease. Electronically Signed   By: Rubye Oaks M.D.   On: 03/11/2017 20:07      SIGNIFICANT EVENTS  7/06  Admit with SOB, BLE swelling   STUDIES:  Renal US 7/6 >> increased renal echogenicity consistent with chronic medical renal disease.   CT Chest W/O 7/6 >> abnormal lung nodules in the bases with  scattered atelectasis, surrounding GGO and mosaic attenuation, potential infrahilar adenopathy bilaterally (difficult to separate due to no contrast and near vessels), small pericardial effusion, low lung volumes.     ASSESSMENT / PLAN:  Discussion: 46 y/o F admitted with SOB and BLE swelling.  Recent admit to [facility] after a viral illness with dehydration, vomiting.  ? Of pancreatitis at [facility] but family not certain.  Volume resuscitated and then required lasix to remove volume.  Now with AKI (? Post diuresis + metformin use).  Developed SOB and had a CT of chest which showed an incidental finding of pulmonary nodules.  Suspect mild tachypnea related to AKI/acidosis. She is not hypoxic / on RA.    Bilateral Pulmonary Nodules  Plan: Follow up with pulmonary in 1-3 months Will need repeat CT of the chest prior to being seen by Pulmonary  Pulmonary hygiene - IS, mobilize  Intermittent CXR  PT efforts    AKI  Plan: Defer work up to primary MD / Nephrology    Canary Brim, NP-C [facility] Pgr: [phone] or if no answer [phone] 03/12/2017, 1:12 PM

## 2017-03-12 NOTE — Progress Notes (Addendum)
Patient ID: Pamela Goodwin, female   DOB: 1970/11/21, 46 y.o.   MRN: [id number]    PROGRESS NOTE  Pamela Goodwin  NWG:[id number] DOB: [birth year] DOA: 03/11/2017  PCP: [name], MD   Brief Narrative:  46 y.o. female with known hypertension, diabetes, recently discharged from [facility] 03/10/2017 after being hospitalized for pancreatitis. Please note that patient does not know details of the exact cause of pancreatitis and is not reliable historian, always referring to her family at bedside to provide information. Patient presented to [facility] emergency department with main concern of progressively worsening dyspnea since the discharge. Patient says that she was short of breath in the hospital but thought it will eventually resolve however, her symptoms got worse and she started to experience dyspnea with exertion as well as dyspnea at rest. Patient also reports progressively worsening lower extremity swelling and weight gain of more than 20 pounds. She says that her baseline weight is around 150 pounds. Patient denies fevers and chills, no specific abdominal concerns. Patient denies chest pain. Please note that most of the history is provided by father at bedside.  ED Course: Patient noted to be tachypnea with respiratory rate in 30s however, oxygen saturation stable above 93% on room air. Blood work notable for WBC 15.8, hemoglobin 8.3, bicarbonate 16, creatinine 3.81. Chest x-ray notable for low lung volumes, questionable atelectasis, no evidence of pneumonia or fulminant edema.   Assessment & Plan:  Active Problems: Dyspnea and tachypnea  - Unclear etiology, CT chest with scattered pulm nodules, ? Atypical infection  - I have given pt one dose of lasix 20 mg IV in ED and she seems bit better this AM but still with mild tachypnea, no hypoxia - I have consulted PCCM for further assistance  - d-dimer elevated, ? Need for V/Q scan if PCCM deems necessary   Acute kidney injury with progression to  ATN - unknown baseline Cr, FENa > 3 - Cr is still up but not much worse in the past 24 hours - renal US with no acute findings and suggestive of chronic medical renal disease  - continue to hold Metformin  - monitor weights, Intake and output  - pt is not dry on exam and no significant volume overload - poor vascular access, eating well,  - will repeat BMP In AM and if Cr worse will ask renal team to see  - this is weight trend since admission:  Filed Weights   03/11/17 1406 03/11/17 2102 03/12/17 0430  Weight: 73 kg (161 lb) 77.3 kg (170 lb 6.4 oz) 79.4 kg (175 lb 0.7 oz)  - BMP in AM  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 Etiology - ? Anemia panel - Hg did drop overnight - no evidence of active bleeding   HTN - continue Norvasc   Leukocytosis - Unclear etiology, patient says she was recently treated for urinary tract infection - UA clear with no evidence of an infection - has not been on ABX and WBC is trending down - will repeat CBC in AM  Oobesity  - Body mass index is 31.44 kg/m.  DVT prophylaxis: Hep SQ Code Status: Full  Family Communication: Patient at bedside, father over the phone  Disposition Plan: to be determined   Consultants:   PCCM  Procedures:   None  Antimicrobials:   None  Subjective: Pt reports persistent Right foot pain and  dyspnea but says overall better.   Objective: Vitals:   03/12/17 0430 03/12/17 0803 03/12/17 0907 03/12/17 0935  BP: 123/86 139/86  124/82  Pulse: 91 (!) 107    Resp: 13 (!) 23    Temp: 98.3 F (36.8 C) 98.4 F (36.9 C)    TempSrc: Oral Oral    SpO2: 96% 97% 97%   Weight: 79.4 kg (175 lb 0.7 oz)     Height:        Intake/Output Summary (Last 24 hours) at 03/12/17 0941 Last data filed at 03/12/17 0820  Gross per 24 hour  Intake              100 ml  Output             1050 ml  Net              -950 ml   Filed Weights   03/11/17 1406 03/11/17 2102 03/12/17 0430  Weight: 73 kg (161 lb) 77.3 kg (170 lb 6.4 oz) 79.4 kg (175 lb 0.7 oz)    Examination:  General exam: Appears calm and comfortable  Respiratory system: diminished air movement at bases but clear overall  Cardiovascular system: S1 & S2 heard, RRR. No JVD, murmurs, rubs, gallops or clicks. +1 bilateral LE edema  Gastrointestinal system: Abdomen is nondistended, soft and nontender. No organomegaly or masses felt. Normal bowel sounds heard. Central nervous system: Alert and oriented. No focal neurological deficits. Extremities: Symmetric 5 x 5 power. Skin: No rashes, lesions or ulcers Psychiatry: Judgement and insight appear normal. Mood & affect appropriate.   Data Reviewed: I have personally reviewed following labs and imaging studies  CBC:  Recent Labs Lab 03/11/17 1425 03/12/17 0100  WBC 15.8* 13.7*  HGB 8.3* 7.7*  HCT 24.9* 23.8*  MCV 87.4 86.5  PLT 297 262   Basic Metabolic Panel:  Recent Labs Lab 03/11/17 1425 03/12/17 0100  NA 139  --   K 4.1  --   CL 112*  --   CO2 16*  --   GLUCOSE 112*  --   BUN 61*  --   CREATININE 3.81* 3.92*  CALCIUM 9.9  --    Recent Labs Lab 03/11/17 1808  LIPASE 108*   Cardiac Enzymes:  Recent Labs Lab 03/12/17 0100  TROPONINI <0.03   HbA1C:  Recent Labs  03/11/17 1929  HGBA1C 6.4*   CBG:  Recent Labs Lab 03/11/17 2248 03/12/17 0801  GLUCAP 133* 96   Thyroid Function Tests:  Recent Labs  03/12/17 0100  TSH 3.320   Urine analysis:    Component Value Date/Time   COLORURINE YELLOW 03/11/2017 1915   APPEARANCEUR HAZY (A) 03/11/2017 1915   LABSPEC 1.011 03/11/2017 1915   PHURINE 5.0 03/11/2017 1915   GLUCOSEU NEGATIVE 03/11/2017 1915   HGBUR SMALL (A) 03/11/2017 1915   BILIRUBINUR NEGATIVE 03/11/2017 1915   KETONESUR NEGATIVE 03/11/2017 1915   PROTEINUR 100 (A) 03/11/2017 1915   NITRITE NEGATIVE 03/11/2017 1915   LEUKOCYTESUR  NEGATIVE 03/11/2017 1915    Recent Results (from the past 240 hour(s))  MRSA PCR Screening     Status: None   Collection Time: 03/11/17  9:04 PM  Result Value Ref Range Status   MRSA by PCR NEGATIVE NEGATIVE Final    Comment:        The GeneXpert MRSA Assay (FDA approved for NASAL specimens only), is one component of a comprehensive MRSA colonization surveillance program. It is not intended to diagnose MRSA infection  nor to guide or monitor treatment for MRSA infections.       Radiology Studies: Dg Chest 2 View  Result Date: 03/11/2017 CLINICAL DATA:  Acute shortness of breath. EXAM: CHEST  2 VIEW COMPARISON:  None. FINDINGS: This is a low volume film. Upper limits normal heart size noted. Bibasilar opacities are identified, favor atelectasis over airspace disease. There is no evidence of pneumothorax or pleural effusion. No bony abnormalities are present. IMPRESSION: Low volume film with bibasilar opacities -favor atelectasis. Upper limits normal heart size. Electronically Signed   By: Harmon Pier M.D.   On: 03/11/2017 15:50   Ct Chest Wo Contrast  Result Date: 03/11/2017 CLINICAL DATA:  Dyspnea and shortness breath intermittently for the last few days. EXAM: CT CHEST WITHOUT CONTRAST TECHNIQUE: Multidetector CT imaging of the chest was performed following the standard protocol without IV contrast. COMPARISON:  03/11/2017 FINDINGS: Body habitus reduces diagnostic sensitivity and specificity. Cardiovascular: Mild cardiomegaly. Mild atherosclerotic calcification in the left anterior descending coronary artery. Trace pericardial effusion. Mediastinum/Nodes: Poor definition of fat planes around the trachea and mainstem bronchi of uncertain significance. No overt pathologic paratracheal or AP window adenopathy. Fullness of both hila, difficult to exclude hilar or infrahilar adenopathy. Lungs/Pleura: Abnormal nodularity in the lung bases. For example, there is a 1.9 by 1.2 cm nodule along the  right lower lobe side of the right major fissure on image 62/4. A subpleural right lower lobe nodule measuring 0.6 by 0.6 cm is present on image 66/4. If anteriorly in the left lower lobe, a 1.5 by 1.3 cm nodular density on image 65/4 is obscured by surrounding bandlike atelectasis. Similarly there is additional nodularity scattered along the left inferior pulmonary ligament and in the subpleural locations of the left lower lobe. This process seems to favor the lower lobes. Low lung volumes are present and there is mosaic attenuation in the lower lobes. With regard to the airways, I do not see peribronchovascular nodularity along the more peripheral airways. Upper Abdomen: Unremarkable Musculoskeletal: Unremarkable IMPRESSION: 1. Abnormal lung nodules in the lung bases with scattered atelectasis. Surrounding ground-glass opacities and mosaic attenuation also favoring the lung bases. Possibilities may include sarcoidosis, fungal disease, or atypical infectious process. Some of these nodules are along pleural surfaces, but I do not see definite paratracheal adenopathy or distal peribronchovascular nodularity to further suggest sarcoidosis. There is potentially infrahilar adenopathy bilaterally, although this is difficult to separate out from the vessels given the lack of IV contrast. Follow up imaging to ensure resolution is recommended to exclude the unlikely possibility of malignancy. 2. Athero left anterior descending coronary atherosclerosis and mild cardiomegaly. 3. Small pericardial effusion. 4. Low lung volumes. Electronically Signed   By: Gaylyn Rong M.D.   On: 03/11/2017 19:29   US Renal  Result Date: 03/11/2017 CLINICAL DATA:  Acute renal failure. EXAM: RENAL / URINARY TRACT ULTRASOUND COMPLETE COMPARISON:  None. FINDINGS: Right Kidney: Length: 9 cm. Increased renal cortical echogenicity. No hydronephrosis visualized. No visualized mass. Left Kidney: Length: 9.5 cm. Increased renal cortical  echogenicity. No hydronephrosis visualized. No visualized mass. Bladder: Appears normal for degree of bladder distention. IMPRESSION: Increased renal echogenicity consistent with chronic medical renal disease. Electronically Signed   By: Rubye Oaks M.D.   On: 03/11/2017 20:07    Scheduled Meds: . amLODipine  10 mg Oral Daily  . furosemide  20 mg Intravenous Once  . heparin  5,000 Units Subcutaneous Q8H  . insulin aspart  0-9 Units Subcutaneous TID WC  . levalbuterol  0.63 mg Nebulization Q6H  . metoprolol tartrate  25 mg Oral BID   Continuous Infusions:   LOS: 1 day   Time spent: 35 minutes   Debbora Presto, MD [facility] Pager [phone]  If 7PM-7AM, please contact night-coverage [email or web address] Password [email or web address] 03/12/2017, 9:41 AM

## 2017-03-12 NOTE — Progress Notes (Addendum)
Consult nephrology Danie BinderIskra Josilyn Shippee

## 2017-03-13 ENCOUNTER — Inpatient Hospital Stay (HOSPITAL_COMMUNITY): Payer: Medicare Other

## 2017-03-13 LAB — CBC
HEMATOCRIT: 22 % — AB (ref 36.0–46.0)
Hemoglobin: 7.1 g/dL — ABNORMAL LOW (ref 12.0–15.0)
MCH: 28.4 pg (ref 26.0–34.0)
MCHC: 32.3 g/dL (ref 30.0–36.0)
MCV: 88 fL (ref 78.0–100.0)
Platelets: 248 10*3/uL (ref 150–400)
RBC: 2.5 MIL/uL — ABNORMAL LOW (ref 3.87–5.11)
RDW: 15.4 % (ref 11.5–15.5)
WBC: 12 10*3/uL — AB (ref 4.0–10.5)

## 2017-03-13 LAB — FOLATE: Folate: 6.1 ng/mL (ref >5.9)

## 2017-03-13 LAB — VITAMIN B12: VITAMIN B 12: 353 pg/mL (ref 180–914)

## 2017-03-13 LAB — IRON AND TIBC
Iron: 41 ug/dL (ref 28–170)
Saturation Ratios: 16 % (ref 10.4–31.8)
TIBC: 262 ug/dL (ref 250–450)
UIBC: 221 ug/dL

## 2017-03-13 LAB — RENAL FUNCTION PANEL
ALBUMIN: 2.6 g/dL — AB (ref 3.5–5.0)
Anion gap: 8 (ref 5–15)
BUN: 61 mg/dL — AB (ref 6–20)
CALCIUM: 10.4 mg/dL — AB (ref 8.9–10.3)
CO2: 19 mmol/L — ABNORMAL LOW (ref 22–32)
CREATININE: 4.03 mg/dL — AB (ref 0.44–1.00)
Chloride: 113 mmol/L — ABNORMAL HIGH (ref 101–111)
GFR calc Af Amer: 14 mL/min — ABNORMAL LOW (ref >60)
GFR, EST NON AFRICAN AMERICAN: 12 mL/min — AB (ref >60)
Glucose, Bld: 121 mg/dL — ABNORMAL HIGH (ref 65–99)
PHOSPHORUS: 6.4 mg/dL — AB (ref 2.5–4.6)
POTASSIUM: 4.6 mmol/L (ref 3.5–5.1)
SODIUM: 140 mmol/L (ref 135–145)

## 2017-03-13 LAB — RETICULOCYTES
RBC.: 2.5 MIL/uL — AB (ref 3.87–5.11)
RETIC COUNT ABSOLUTE: 75 10*3/uL (ref 19.0–186.0)
RETIC CT PCT: 3 % (ref 0.4–3.1)

## 2017-03-13 LAB — GLUCOSE, CAPILLARY
GLUCOSE-CAPILLARY: 112 mg/dL — AB (ref 65–99)
GLUCOSE-CAPILLARY: 104 mg/dL — AB (ref 65–99)
GLUCOSE-CAPILLARY: 114 mg/dL — AB (ref 65–99)
Glucose-Capillary: 105 mg/dL — ABNORMAL HIGH (ref 65–99)

## 2017-03-13 LAB — URIC ACID: URIC ACID, SERUM: 10.9 mg/dL — AB (ref 2.3–6.6)

## 2017-03-13 LAB — FERRITIN: Ferritin: 51 ng/mL (ref 11–307)

## 2017-03-13 NOTE — Progress Notes (Signed)
[facility] MD order. Medical Records department is open Monday - Friday. Prepared medical record request with patient signature and requested nurse secretary to fax on Monday. Updated RN report sheet and providing information/update in hand off report to PM nurse.

## 2017-03-13 NOTE — Progress Notes (Signed)
Patient ID: Pamela Goodwin, female   DOB: [birth year], 46 y.o.   MRN: [id number]    PROGRESS NOTE  Pamela Goodwin  OZH:[id number] DOB: [birth year] DOA: 03/11/2017  PCP: Kirstie PeriShah, Ashish, MD   Brief Narrative:  46 y.o. female with known hypertension, diabetes, recently discharged from [facility] 03/10/2017 after being hospitalized for pancreatitis. Please note that patient does not know details of the exact cause of pancreatitis and is not reliable historian, always referring to her family at bedside to provide information. Patient presented to [facility] emergency department with main concern of progressively worsening dyspnea since the discharge. Patient says that she was short of breath in the hospital but thought it will eventually resolve however, her symptoms got worse and she started to experience dyspnea with exertion as well as dyspnea at rest. Patient also reports progressively worsening lower extremity swelling and weight gain of more than 20 pounds. She says that her baseline weight is around 150 pounds. Patient denies fevers and chills, no specific abdominal concerns. Patient denies chest pain. Please note that most of the history is provided by father at bedside.  ED Course: Patient noted to be tachypnea with respiratory rate in 30s however, oxygen saturation stable above 93% on room air. Blood work notable for WBC 15.8, hemoglobin 8.3, bicarbonate 16, creatinine 3.81. Chest x-ray notable for low lung volumes, questionable atelectasis, no evidence of pneumonia or fulminant edema.   Assessment & Plan:  Active Problems: Dyspnea and tachypnea  - Unclear etiology, CT chest with scattered pulm nodules, ? Atypical infection  - I have given pt one dose of lasix 20 mg IV in ED, no need for more lasix at this time  - PCCM has been consulted, continue pt on same regimen with outpatient follow up   Acute kidney injury with progression to ATN - unknown baseline Cr, FENa > 3 - Cr is still up - renal US with  no acute findings and suggestive of chronic medical renal disease  - continue to hold Metformin  - monitor weights, Intake and output  - this is weight trend since admission:  Filed Weights   03/11/17 2102 03/12/17 0430 03/13/17 0432  Weight: 77.3 kg (170 lb 6.4 oz) 79.4 kg (175 lb 0.7 oz) 79 kg (174 lb 2.6 oz)  - appreciate nephrology team assistance   Right foot pain - xray pending  - check uric acid  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 Etiology - anemia panel pending  - no evidence of active bleeding - CBC in AM  HTN - continue Norvasc   Leukocytosis - Unclear etiology, patient says she was recently treated for urinary tract infection - UA clear with no evidence of an infection - WBC still at 12 - repeat BMP in AM  Oobesity  - Body mass index is 31.44 kg/m.  DVT prophylaxis: Hep SQ Code Status: Full  Family Communication: Patient at bedside, father over the phone  Disposition Plan: to be determined   Consultants:   PCCM  Nephrology   Procedures:   None  Antimicrobials:   None  Subjective: Pt reports fight foot pain.   Objective: Vitals:   03/13/17 1005 03/13/17 1006 03/13/17 1206 03/13/17 1634  BP: (!) 147/102  (!) 147/93 (!) 143/90  Pulse:  (!) 114 (!) 103 99  Resp:   (!) 24 (!) 23  Temp:   98.2 F (36.8 C) 98.2  F (36.8 C)  TempSrc:   Oral Oral  SpO2:   100% 98%  Weight:      Height:        Intake/Output Summary (Last 24 hours) at 03/13/17 1656 Last data filed at 03/13/17 1635  Gross per 24 hour  Intake              360 ml  Output             2300 ml  Net            -1940 ml   Filed Weights   03/11/17 2102 03/12/17 0430 03/13/17 0432  Weight: 77.3 kg (170 lb 6.4 oz) 79.4 kg (175 lb 0.7 oz) 79 kg (174 lb 2.6 oz)    Physical Exam  Constitutional: Appears well-developed and well-nourished. No distress.  CVS: RRR,  S1/S2 +, no murmurs, no gallops, no carotid bruit.  Pulmonary: Effort and breath sounds somewhat shallow, no wheezing  Abdominal: Soft. BS +,  no distension, tenderness, rebound or guarding.  Musculoskeletal: Normal range of motion. Mild LE edema  Lymphadenopathy: No lymphadenopathy noted, cervical, inguinal. Neuro: Alert. Normal reflexes, muscle tone coordination. No cranial nerve deficit.   Data Reviewed: I have personally reviewed following labs and imaging studies  CBC:  Recent Labs Lab 03/11/17 1425 03/12/17 0100 03/12/17 1013 03/13/17 0311  WBC 15.8* 13.7* 12.0* 12.0*  HGB 8.3* 7.7* 7.3* 7.1*  HCT 24.9* 23.8* 22.3* 22.0*  MCV 87.4 86.5 87.1 88.0  PLT 297 262 244 248   Basic Metabolic Panel:  Recent Labs Lab 03/11/17 1425 03/12/17 0100 03/12/17 1013 03/13/17 0311  NA 139  --  140 140  K 4.1  --  4.1 4.6  CL 112*  --  113* 113*  CO2 16*  --  18* 19*  GLUCOSE 112*  --  184* 121*  BUN 61*  --  59* 61*  CREATININE 3.81* 3.92* 3.90* 4.03*  CALCIUM 9.9  --  10.1 10.4*  PHOS  --   --   --  6.4*    Recent Labs Lab 03/11/17 1808 03/12/17 1013  LIPASE 108* 109*   Cardiac Enzymes:  Recent Labs Lab 03/12/17 0100 03/12/17 1013  TROPONINI <0.03 <0.03   HbA1C:  Recent Labs  03/11/17 1929  HGBA1C 6.4*   CBG:  Recent Labs Lab 03/12/17 1643 03/12/17 2118 03/13/17 0752 03/13/17 1206 03/13/17 1631  GLUCAP 207* 133* 112* 104* 105*   Thyroid Function Tests:  Recent Labs  03/12/17 0100  TSH 3.320   Urine analysis:    Component Value Date/Time   COLORURINE YELLOW 03/11/2017 1915   APPEARANCEUR HAZY (A) 03/11/2017 1915   LABSPEC 1.011 03/11/2017 1915   PHURINE 5.0 03/11/2017 1915   GLUCOSEU NEGATIVE 03/11/2017 1915   HGBUR SMALL (A) 03/11/2017 1915   BILIRUBINUR NEGATIVE 03/11/2017 1915   KETONESUR NEGATIVE 03/11/2017 1915   PROTEINUR 100 (A) 03/11/2017 1915   NITRITE NEGATIVE 03/11/2017 1915   LEUKOCYTESUR NEGATIVE 03/11/2017 1915     Recent Results (from the past 240 hour(s))  MRSA PCR Screening     Status: None   Collection Time: 03/11/17  9:04 PM  Result Value Ref Range Status   MRSA by PCR NEGATIVE NEGATIVE Final    Comment:        The GeneXpert MRSA Assay (FDA approved for NASAL specimens only), is one component of a comprehensive MRSA colonization surveillance program. It is not intended to diagnose MRSA infection nor to guide or monitor treatment for  MRSA infections.   Fungus culture, blood     Status: None (Preliminary result)   Collection Time: 03/12/17 10:13 AM  Result Value Ref Range Status   Specimen Description BLOOD RIGHT HAND  Final   Special Requests   Final    BOTTLES DRAWN AEROBIC ONLY Blood Culture adequate volume   Culture NO GROWTH < 24 HOURS  Final   Report Status PENDING  Incomplete  Culture, blood (Routine X 2) w Reflex to ID Panel     Status: None (Preliminary result)   Collection Time: 03/12/17 10:13 AM  Result Value Ref Range Status   Specimen Description BLOOD RIGHT HAND  Final   Special Requests   Final    BOTTLES DRAWN AEROBIC ONLY Blood Culture adequate volume   Culture NO GROWTH < 24 HOURS  Final   Report Status PENDING  Incomplete      Radiology Studies: Ct Chest Wo Contrast  Result Date: 03/11/2017 CLINICAL DATA:  Dyspnea and shortness breath intermittently for the last few days. EXAM: CT CHEST WITHOUT CONTRAST TECHNIQUE: Multidetector CT imaging of the chest was performed following the standard protocol without IV contrast. COMPARISON:  03/11/2017 FINDINGS: Body habitus reduces diagnostic sensitivity and specificity. Cardiovascular: Mild cardiomegaly. Mild atherosclerotic calcification in the left anterior descending coronary artery. Trace pericardial effusion. Mediastinum/Nodes: Poor definition of fat planes around the trachea and mainstem bronchi of uncertain significance. No overt pathologic paratracheal or AP window adenopathy. Fullness of both hila, difficult to  exclude hilar or infrahilar adenopathy. Lungs/Pleura: Abnormal nodularity in the lung bases. For example, there is a 1.9 by 1.2 cm nodule along the right lower lobe side of the right major fissure on image 62/4. A subpleural right lower lobe nodule measuring 0.6 by 0.6 cm is present on image 66/4. If anteriorly in the left lower lobe, a 1.5 by 1.3 cm nodular density on image 65/4 is obscured by surrounding bandlike atelectasis. Similarly there is additional nodularity scattered along the left inferior pulmonary ligament and in the subpleural locations of the left lower lobe. This process seems to favor the lower lobes. Low lung volumes are present and there is mosaic attenuation in the lower lobes. With regard to the airways, I do not see peribronchovascular nodularity along the more peripheral airways. Upper Abdomen: Unremarkable Musculoskeletal: Unremarkable IMPRESSION: 1. Abnormal lung nodules in the lung bases with scattered atelectasis. Surrounding ground-glass opacities and mosaic attenuation also favoring the lung bases. Possibilities may include sarcoidosis, fungal disease, or atypical infectious process. Some of these nodules are along pleural surfaces, but I do not see definite paratracheal adenopathy or distal peribronchovascular nodularity to further suggest sarcoidosis. There is potentially infrahilar adenopathy bilaterally, although this is difficult to separate out from the vessels given the lack of IV contrast. Follow up imaging to ensure resolution is recommended to exclude the unlikely possibility of malignancy. 2. Athero left anterior descending coronary atherosclerosis and mild cardiomegaly. 3. Small pericardial effusion. 4. Low lung volumes. Electronically Signed   By: Gaylyn Rong M.D.   On: 03/11/2017 19:29   US Renal  Result Date: 03/11/2017 CLINICAL DATA:  Acute renal failure. EXAM: RENAL / URINARY TRACT ULTRASOUND COMPLETE COMPARISON:  None. FINDINGS: Right Kidney: Length: 9 cm.  Increased renal cortical echogenicity. No hydronephrosis visualized. No visualized mass. Left Kidney: Length: 9.5 cm. Increased renal cortical echogenicity. No hydronephrosis visualized. No visualized mass. Bladder: Appears normal for degree of bladder distention. IMPRESSION: Increased renal echogenicity consistent with chronic medical renal disease. Electronically Signed   By: Shawna Orleans  Ehinger M.D.   On: 03/11/2017 20:07   Dg Foot Complete Right  Result Date: 03/13/2017 CLINICAL DATA:  Right foot pain with swelling. EXAM: RIGHT FOOT COMPLETE - 3+ VIEW COMPARISON:  None. FINDINGS: No fracture. No subluxation or dislocation. Hallux valgus deformity noted. IMPRESSION: Negative. Electronically Signed   By: Kennith Center M.D.   On: 03/13/2017 12:08    Scheduled Meds: . amLODipine  10 mg Oral Daily  . furosemide  20 mg Intravenous Once  . heparin  5,000 Units Subcutaneous Q8H  . insulin aspart  0-9 Units Subcutaneous TID WC  . levalbuterol  0.63 mg Nebulization BID  . metoprolol tartrate  25 mg Oral BID   Continuous Infusions:   LOS: 2 days   Time spent: 35 minutes   Debbora Presto, MD [facility] Pager [phone]  If 7PM-7AM, please contact night-coverage [email or web address] Password [email or web address] 03/13/2017, 4:56 PM

## 2017-03-13 NOTE — Consult Note (Signed)
Reason for Consult: Renal failure Referring Physician:  Dr. Mart Piggs  Chief Complaint: Dyspnea  Assessment/Plan: 1. Acute Kidney Injury - very poor historian and outside hospital records not available. I wonder if she received contrast abdominal CT at the other hospital. The urinalysis is not overly impressive, mild proteinuria and appears to be an inactive sediment. She may have prerenal azotemia vs ATN; GN is much lower on the differential. - Ultrasound sounds medical renal disease but no thinning of the cortex or obstruction. - Will check serologies to be complete given proteinuria but that may be from the diabetes; unclear how long she's had diabetes. - We will call the primary MD Dr. Brigitte Pulse at [phone] Monday to determine whether she has a history of CKD. - Avoid nephrotoxic agents as you are already doing and dose meds for a GFR of <25. - Would hold off on further diuretics; she doesn't appear to be that volume overloaded on PE c/w CXR findings as well. 2. Dyspnea - unclear etiology but an echo would be helpful. 3. HTN 4. Pumonary nodules    HPI: Pamela Goodwin is an 46 y.o. female HTN, DM recently hospitalized for pancreatitis of which the abdominal pain has resolved. Pt is a very poor historian and she actually called her father who confirmed while I was bedside. Per father, the pt has never been told she has kidney issues and follows regular with a Dr. Brigitte Pulse 3-4x per year [phone]). Her father stated that the pt does not have rashes, obstructive sxs, nephrolithiasis, frequent UTI's, ever been diagnosed with AKI and there is no ESRD in the family. She uses Motrin every few weeks but certainly not on a regular basis. She is presenting with dyspnea which she states has been present for ~3 weeks and currently is not any worse that over that same period of time. She has DOE and a weight gain of >20 lbs. She states that the abd pain is resolved w/ no nausea, vomiting, fever, chills,  productive cough, hemoptysis or hematuria.  ROS Pertinent items are noted in HPI.  Chemistry and CBC: Creatinine, Ser  Date/Time Value Ref Range Status  03/13/2017 03:11 AM 4.03 (H) 0.44 - 1.00 mg/dL Final  03/12/2017 10:13 AM 3.90 (H) 0.44 - 1.00 mg/dL Final  03/12/2017 01:00 AM 3.92 (H) 0.44 - 1.00 mg/dL Final  03/11/2017 02:25 PM 3.81 (H) 0.44 - 1.00 mg/dL Final    Recent Labs Lab 03/11/17 1425 03/12/17 0100 03/12/17 1013 03/13/17 0311  NA 139  --  140 140  K 4.1  --  4.1 4.6  CL 112*  --  113* 113*  CO2 16*  --  18* 19*  GLUCOSE 112*  --  184* 121*  BUN 61*  --  59* 61*  CREATININE 3.81* 3.92* 3.90* 4.03*  CALCIUM 9.9  --  10.1 10.4*  PHOS  --   --   --  6.4*    Recent Labs Lab 03/11/17 1425 03/12/17 0100 03/12/17 1013 03/13/17 0311  WBC 15.8* 13.7* 12.0* 12.0*  HGB 8.3* 7.7* 7.3* 7.1*  HCT 24.9* 23.8* 22.3* 22.0*  MCV 87.4 86.5 87.1 88.0  PLT 297 262 244 248   Liver Function Tests:  Recent Labs Lab 03/12/17 1013 03/13/17 0311  AST 19  --   ALT 15  --   ALKPHOS 34*  --   BILITOT 0.4  --   PROT 6.2*  --   ALBUMIN 2.6* 2.6*    Recent Labs Lab 03/11/17 1808 03/12/17 1013  LIPASE 108* 109*   No results for input(s): AMMONIA in the last 168 hours. Cardiac Enzymes:  Recent Labs Lab 03/12/17 0100 03/12/17 1013  TROPONINI <0.03 <0.03   Iron Studies:  Recent Labs  03/13/17 0311  IRON 41  TIBC 262  FERRITIN 51   PT/INR: @LABRCNTIP (inr:5)  Xrays/Other Studies: ) Results for orders placed or performed during the hospital encounter of 03/11/17 (from the past 48 hour(s))  CBC     Status: Abnormal   Collection Time: 03/11/17  2:25 PM  Result Value Ref Range   WBC 15.8 (H) 4.0 - 10.5 K/uL   RBC 2.85 (L) 3.87 - 5.11 MIL/uL   Hemoglobin 8.3 (L) 12.0 - 15.0 g/dL   HCT 24.9 (L) 36.0 - 46.0 %   MCV 87.4 78.0 - 100.0 fL   MCH 29.1 26.0 - 34.0 pg   MCHC 33.3 30.0 - 36.0 g/dL   RDW 15.0 11.5 - 15.5 %   Platelets 297 150 - 400 K/uL  Basic  metabolic panel     Status: Abnormal   Collection Time: 03/11/17  2:25 PM  Result Value Ref Range   Sodium 139 135 - 145 mmol/L   Potassium 4.1 3.5 - 5.1 mmol/L   Chloride 112 (H) 101 - 111 mmol/L   CO2 16 (L) 22 - 32 mmol/L   Glucose, Bld 112 (H) 65 - 99 mg/dL   BUN 61 (H) 6 - 20 mg/dL   Creatinine, Ser 3.81 (H) 0.44 - 1.00 mg/dL   Calcium 9.9 8.9 - 10.3 mg/dL   GFR calc non Af Amer 13 (L) >60 mL/min   GFR calc Af Amer 15 (L) >60 mL/min    Comment: (NOTE) The eGFR has been calculated using the CKD EPI equation. This calculation has not been validated in all clinical situations. eGFR's persistently <60 mL/min signify possible Chronic Kidney Disease.    Anion gap 11 5 - 15  Brain natriuretic peptide     Status: Abnormal   Collection Time: 03/11/17  2:25 PM  Result Value Ref Range   B Natriuretic Peptide 135.6 (H) 0.0 - 100.0 pg/mL  Lipase, blood     Status: Abnormal   Collection Time: 03/11/17  6:08 PM  Result Value Ref Range   Lipase 108 (H) 11 - 51 U/L  Sodium, urine, random     Status: None   Collection Time: 03/11/17  7:15 PM  Result Value Ref Range   Sodium, Ur 79 mmol/L  Creatinine, urine, random     Status: None   Collection Time: 03/11/17  7:15 PM  Result Value Ref Range   Creatinine, Urine 60.96 mg/dL  Urinalysis, Routine w reflex microscopic     Status: Abnormal   Collection Time: 03/11/17  7:15 PM  Result Value Ref Range   Color, Urine YELLOW YELLOW   APPearance HAZY (A) CLEAR   Specific Gravity, Urine 1.011 1.005 - 1.030   pH 5.0 5.0 - 8.0   Glucose, UA NEGATIVE NEGATIVE mg/dL   Hgb urine dipstick SMALL (A) NEGATIVE   Bilirubin Urine NEGATIVE NEGATIVE   Ketones, ur NEGATIVE NEGATIVE mg/dL   Protein, ur 100 (A) NEGATIVE mg/dL   Nitrite NEGATIVE NEGATIVE   Leukocytes, UA NEGATIVE NEGATIVE   RBC / HPF 0-5 0 - 5 RBC/hpf   WBC, UA 0-5 0 - 5 WBC/hpf   Bacteria, UA RARE (A) NONE SEEN   Squamous Epithelial / LPF 0-5 (A) NONE SEEN   Mucous PRESENT   Hemoglobin  A1c  Status: Abnormal   Collection Time: 03/11/17  7:29 PM  Result Value Ref Range   Hgb A1c MFr Bld 6.4 (H) 4.8 - 5.6 %    Comment: (NOTE)         Pre-diabetes: 5.7 - 6.4         Diabetes: >6.4         Glycemic control for adults with diabetes: <7.0    Mean Plasma Glucose 137 mg/dL    Comment: (NOTE) Performed At: [facility] [place], [place] [id number] Lindon Romp MD [id number]   MRSA PCR Screening     Status: None   Collection Time: 03/11/17  9:04 PM  Result Value Ref Range   MRSA by PCR NEGATIVE NEGATIVE    Comment:        The GeneXpert MRSA Assay (FDA approved for NASAL specimens only), is one component of a comprehensive MRSA colonization surveillance program. It is not intended to diagnose MRSA infection nor to guide or monitor treatment for MRSA infections.   D-dimer, quantitative (not at [facility])     Status: Abnormal   Collection Time: 03/11/17  9:24 PM  Result Value Ref Range   D-Dimer, Quant 2.51 (H) 0.00 - 0.50 ug/mL-FEU    Comment: (NOTE) At the manufacturer cut-off of 0.50 ug/mL FEU, this assay has been documented to exclude PE with a sensitivity and negative predictive value of 97 to 99%.  At this time, this assay has not been approved by the FDA to exclude DVT/VTE. Results should be correlated with clinical presentation.   Glucose, capillary     Status: Abnormal   Collection Time: 03/11/17 10:48 PM  Result Value Ref Range   Glucose-Capillary 133 (H) 65 - 99 mg/dL  HIV antibody (Routine Testing)     Status: None   Collection Time: 03/12/17  1:00 AM  Result Value Ref Range   HIV Screen 4th Generation wRfx Non Reactive Non Reactive    Comment: (NOTE) Performed At: [facility] [place], [place] [id number] Lindon Romp MD SP:[id number]   CBC     Status: Abnormal   Collection Time: 03/12/17  1:00 AM  Result Value Ref Range   WBC 13.7 (H) 4.0 - 10.5 K/uL   RBC 2.75 (L) 3.87 - 5.11 MIL/uL    Hemoglobin 7.7 (L) 12.0 - 15.0 g/dL   HCT 23.8 (L) 36.0 - 46.0 %   MCV 86.5 78.0 - 100.0 fL   MCH 28.0 26.0 - 34.0 pg   MCHC 32.4 30.0 - 36.0 g/dL   RDW 15.2 11.5 - 15.5 %   Platelets 262 150 - 400 K/uL  Creatinine, serum     Status: Abnormal   Collection Time: 03/12/17  1:00 AM  Result Value Ref Range   Creatinine, Ser 3.92 (H) 0.44 - 1.00 mg/dL   GFR calc non Af Amer 13 (L) >60 mL/min   GFR calc Af Amer 15 (L) >60 mL/min    Comment: (NOTE) The eGFR has been calculated using the CKD EPI equation. This calculation has not been validated in all clinical situations. eGFR's persistently <60 mL/min signify possible Chronic Kidney Disease.   TSH     Status: None   Collection Time: 03/12/17  1:00 AM  Result Value Ref Range   TSH 3.320 0.350 - 4.500 uIU/mL    Comment: Performed by a 3rd Generation assay with a functional sensitivity of <=0.01 uIU/mL.  Troponin I     Status: None   Collection  Time: 03/12/17  1:00 AM  Result Value Ref Range   Troponin I <0.03 <0.03 ng/mL  Glucose, capillary     Status: None   Collection Time: 03/12/17  8:01 AM  Result Value Ref Range   Glucose-Capillary 96 65 - 99 mg/dL  Troponin I     Status: None   Collection Time: 03/12/17 10:13 AM  Result Value Ref Range   Troponin I <0.03 <0.03 ng/mL  Comprehensive metabolic panel     Status: Abnormal   Collection Time: 03/12/17 10:13 AM  Result Value Ref Range   Sodium 140 135 - 145 mmol/L   Potassium 4.1 3.5 - 5.1 mmol/L   Chloride 113 (H) 101 - 111 mmol/L   CO2 18 (L) 22 - 32 mmol/L   Glucose, Bld 184 (H) 65 - 99 mg/dL   BUN 59 (H) 6 - 20 mg/dL   Creatinine, Ser 3.90 (H) 0.44 - 1.00 mg/dL   Calcium 10.1 8.9 - 10.3 mg/dL   Total Protein 6.2 (L) 6.5 - 8.1 g/dL   Albumin 2.6 (L) 3.5 - 5.0 g/dL   AST 19 15 - 41 U/L   ALT 15 14 - 54 U/L   Alkaline Phosphatase 34 (L) 38 - 126 U/L   Total Bilirubin 0.4 0.3 - 1.2 mg/dL   GFR calc non Af Amer 13 (L) >60 mL/min   GFR calc Af Amer 15 (L) >60 mL/min     Comment: (NOTE) The eGFR has been calculated using the CKD EPI equation. This calculation has not been validated in all clinical situations. eGFR's persistently <60 mL/min signify possible Chronic Kidney Disease.    Anion gap 9 5 - 15  CBC     Status: Abnormal   Collection Time: 03/12/17 10:13 AM  Result Value Ref Range   WBC 12.0 (H) 4.0 - 10.5 K/uL   RBC 2.56 (L) 3.87 - 5.11 MIL/uL   Hemoglobin 7.3 (L) 12.0 - 15.0 g/dL   HCT 22.3 (L) 36.0 - 46.0 %   MCV 87.1 78.0 - 100.0 fL   MCH 28.5 26.0 - 34.0 pg   MCHC 32.7 30.0 - 36.0 g/dL   RDW 15.2 11.5 - 15.5 %   Platelets 244 150 - 400 K/uL  Lipase, blood     Status: Abnormal   Collection Time: 03/12/17 10:13 AM  Result Value Ref Range   Lipase 109 (H) 11 - 51 U/L  Glucose, capillary     Status: Abnormal   Collection Time: 03/12/17 12:58 PM  Result Value Ref Range   Glucose-Capillary 120 (H) 65 - 99 mg/dL  Glucose, capillary     Status: Abnormal   Collection Time: 03/12/17  4:43 PM  Result Value Ref Range   Glucose-Capillary 207 (H) 65 - 99 mg/dL  Glucose, capillary     Status: Abnormal   Collection Time: 03/12/17  9:18 PM  Result Value Ref Range   Glucose-Capillary 133 (H) 65 - 99 mg/dL  CBC     Status: Abnormal   Collection Time: 03/13/17  3:11 AM  Result Value Ref Range   WBC 12.0 (H) 4.0 - 10.5 K/uL   RBC 2.50 (L) 3.87 - 5.11 MIL/uL   Hemoglobin 7.1 (L) 12.0 - 15.0 g/dL   HCT 22.0 (L) 36.0 - 46.0 %   MCV 88.0 78.0 - 100.0 fL   MCH 28.4 26.0 - 34.0 pg   MCHC 32.3 30.0 - 36.0 g/dL   RDW 15.4 11.5 - 15.5 %   Platelets 248 150 -  400 K/uL  Renal function panel     Status: Abnormal   Collection Time: 03/13/17  3:11 AM  Result Value Ref Range   Sodium 140 135 - 145 mmol/L   Potassium 4.6 3.5 - 5.1 mmol/L   Chloride 113 (H) 101 - 111 mmol/L   CO2 19 (L) 22 - 32 mmol/L   Glucose, Bld 121 (H) 65 - 99 mg/dL   BUN 61 (H) 6 - 20 mg/dL   Creatinine, Ser 4.03 (H) 0.44 - 1.00 mg/dL   Calcium 10.4 (H) 8.9 - 10.3 mg/dL    Phosphorus 6.4 (H) 2.5 - 4.6 mg/dL   Albumin 2.6 (L) 3.5 - 5.0 g/dL   GFR calc non Af Amer 12 (L) >60 mL/min   GFR calc Af Amer 14 (L) >60 mL/min    Comment: (NOTE) The eGFR has been calculated using the CKD EPI equation. This calculation has not been validated in all clinical situations. eGFR's persistently <60 mL/min signify possible Chronic Kidney Disease.    Anion gap 8 5 - 15  Vitamin B12     Status: None   Collection Time: 03/13/17  3:11 AM  Result Value Ref Range   Vitamin B-12 353 180 - 914 pg/mL    Comment: (NOTE) This assay is not validated for testing neonatal or myeloproliferative syndrome specimens for Vitamin B12 levels.   Folate     Status: None   Collection Time: 03/13/17  3:11 AM  Result Value Ref Range   Folate 6.1 >5.9 ng/mL  Iron and TIBC     Status: None   Collection Time: 03/13/17  3:11 AM  Result Value Ref Range   Iron 41 28 - 170 ug/dL   TIBC 262 250 - 450 ug/dL   Saturation Ratios 16 10.4 - 31.8 %   UIBC 221 ug/dL  Ferritin     Status: None   Collection Time: 03/13/17  3:11 AM  Result Value Ref Range   Ferritin 51 11 - 307 ng/mL  Reticulocytes     Status: Abnormal   Collection Time: 03/13/17  3:11 AM  Result Value Ref Range   Retic Ct Pct 3.0 0.4 - 3.1 %   RBC. 2.50 (L) 3.87 - 5.11 MIL/uL   Retic Count, Absolute 75.0 19.0 - 186.0 K/uL  Glucose, capillary     Status: Abnormal   Collection Time: 03/13/17  7:52 AM  Result Value Ref Range   Glucose-Capillary 112 (H) 65 - 99 mg/dL   Dg Chest 2 View  Result Date: 03/11/2017 CLINICAL DATA:  Acute shortness of breath. EXAM: CHEST  2 VIEW COMPARISON:  None. FINDINGS: This is a low volume film. Upper limits normal heart size noted. Bibasilar opacities are identified, favor atelectasis over airspace disease. There is no evidence of pneumothorax or pleural effusion. No bony abnormalities are present. IMPRESSION: Low volume film with bibasilar opacities -favor atelectasis. Upper limits normal heart size.  Electronically Signed   By: Margarette Canada M.D.   On: 03/11/2017 15:50   Ct Chest Wo Contrast  Result Date: 03/11/2017 CLINICAL DATA:  Dyspnea and shortness breath intermittently for the last few days. EXAM: CT CHEST WITHOUT CONTRAST TECHNIQUE: Multidetector CT imaging of the chest was performed following the standard protocol without IV contrast. COMPARISON:  03/11/2017 FINDINGS: Body habitus reduces diagnostic sensitivity and specificity. Cardiovascular: Mild cardiomegaly. Mild atherosclerotic calcification in the left anterior descending coronary artery. Trace pericardial effusion. Mediastinum/Nodes: Poor definition of fat planes around the trachea and mainstem bronchi of uncertain significance. No  overt pathologic paratracheal or AP window adenopathy. Fullness of both hila, difficult to exclude hilar or infrahilar adenopathy. Lungs/Pleura: Abnormal nodularity in the lung bases. For example, there is a 1.9 by 1.2 cm nodule along the right lower lobe side of the right major fissure on image 62/4. A subpleural right lower lobe nodule measuring 0.6 by 0.6 cm is present on image 66/4. If anteriorly in the left lower lobe, a 1.5 by 1.3 cm nodular density on image 65/4 is obscured by surrounding bandlike atelectasis. Similarly there is additional nodularity scattered along the left inferior pulmonary ligament and in the subpleural locations of the left lower lobe. This process seems to favor the lower lobes. Low lung volumes are present and there is mosaic attenuation in the lower lobes. With regard to the airways, I do not see peribronchovascular nodularity along the more peripheral airways. Upper Abdomen: Unremarkable Musculoskeletal: Unremarkable IMPRESSION: 1. Abnormal lung nodules in the lung bases with scattered atelectasis. Surrounding ground-glass opacities and mosaic attenuation also favoring the lung bases. Possibilities may include sarcoidosis, fungal disease, or atypical infectious process. Some of these  nodules are along pleural surfaces, but I do not see definite paratracheal adenopathy or distal peribronchovascular nodularity to further suggest sarcoidosis. There is potentially infrahilar adenopathy bilaterally, although this is difficult to separate out from the vessels given the lack of IV contrast. Follow up imaging to ensure resolution is recommended to exclude the unlikely possibility of malignancy. 2. Athero left anterior descending coronary atherosclerosis and mild cardiomegaly. 3. Small pericardial effusion. 4. Low lung volumes. Electronically Signed   By: Van Clines M.D.   On: 03/11/2017 19:29   US Renal  Result Date: 03/11/2017 CLINICAL DATA:  Acute renal failure. EXAM: RENAL / URINARY TRACT ULTRASOUND COMPLETE COMPARISON:  None. FINDINGS: Right Kidney: Length: 9 cm. Increased renal cortical echogenicity. No hydronephrosis visualized. No visualized mass. Left Kidney: Length: 9.5 cm. Increased renal cortical echogenicity. No hydronephrosis visualized. No visualized mass. Bladder: Appears normal for degree of bladder distention. IMPRESSION: Increased renal echogenicity consistent with chronic medical renal disease. Electronically Signed   By: Jeb Levering M.D.   On: 03/11/2017 20:07    PMH:   Past Medical History:  Diagnosis Date  . Diabetes mellitus without complication ([place])   . Hypertension     PSH:  History reviewed. No pertinent surgical history.  Allergies:  Allergies  Allergen Reactions  . Shellfish Allergy Anaphylaxis    Angioedema also  . Hydralazine Other (See Comments)    Patient "zoned out" and caused extreme lethargy (??)    Medications:   Prior to Admission medications   Medication Sig Start Date End Date Taking? Authorizing Provider  amLODipine (NORVASC) 10 MG tablet Take 10 mg by mouth daily.   Yes [provider]  Cholecalciferol (VITAMIN D3) 50000 units CAPS Take 50,000 Units by mouth every 7 (seven) days.   Yes [provider]   Dextromethorphan-Guaifenesin (ROBITUSSIN SUGAR FREE) 10-100 MG/5ML liquid Take 5 mLs by mouth every 12 (twelve) hours as needed (for cough/congestion).   Yes [provider]  fluticasone (FLONASE) 50 MCG/ACT nasal spray Place 1 spray into both nostrils daily as needed for allergies or rhinitis.   Yes [provider]  gabapentin (NEURONTIN) 300 MG capsule Take 300 mg by mouth 3 (three) times daily.   Yes [provider]  levofloxacin (LEVAQUIN) 250 MG tablet Take 250 mg by mouth daily. For 10 days 03/10/17  Yes [provider]  metFORMIN (GLUCOPHAGE) 500 MG tablet Take  500 mg by mouth 2 (two) times daily.   Yes [provider]  methocarbamol (ROBAXIN) 500 MG tablet Take 500 mg by mouth every 8 (eight) hours as needed for muscle spasms.   Yes [provider]  metoprolol tartrate (LOPRESSOR) 25 MG tablet Take 25 mg by mouth 2 (two) times daily.   Yes [provider]  Olopatadine HCl (PAZEO) 0.7 % SOLN Place 1 drop into both eyes daily as needed (for irritation).   Yes [provider]    Discontinued Meds:   Medications Discontinued During This Encounter  Medication Reason  . levofloxacin (LEVAQUIN) 500 MG tablet Entry Error  . furosemide (LASIX) injection 40 mg   . heparin injection 5,000 Units   . levalbuterol (XOPENEX) nebulizer solution 0.63 mg     Social History:  reports that she has never smoked. She has never used smokeless tobacco. She reports that she does not drink alcohol or use drugs.  Family History:   Family History  Problem Relation Age of Onset  . Other Mother        Glioblastoma, deceased    Blood pressure (!) 153/92, pulse (!) 104, temperature 98.4 F (36.9 C), temperature source Oral, resp. rate 18, height 5' (1.524 m), weight 79 kg (174 lb 2.6 oz), last menstrual period 02/22/2017, SpO2 98 %. General appearance: alert, cooperative and appears stated age Head: Normocephalic, without obvious  abnormality, atraumatic Eyes: negative Neck: no adenopathy, no carotid bruit, supple, symmetrical, trachea midline and thyroid not enlarged, symmetric, no tenderness/mass/nodules Back: symmetric, no curvature. ROM normal. No CVA tenderness. Resp: clear to auscultation bilaterally and dstant Chest wall: no tenderness Cardio: regular rate and rhythm, S1, S2 normal, no murmur, click, rub or gallop GI: soft, non-tender; bowel sounds normal; no masses,  no organomegaly Extremities: edema tr Pulses: 2+ and symmetric Skin: Skin color, texture, turgor normal. No rashes or lesions Lymph nodes: Cervical, supraclavicular, and axillary nodes normal. Neurologic: Grossly normal       Akeia Perot, Hunt Oris, MD 03/13/2017, 9:19 AM

## 2017-03-14 ENCOUNTER — Inpatient Hospital Stay (HOSPITAL_COMMUNITY): Payer: Medicare Other

## 2017-03-14 LAB — BASIC METABOLIC PANEL
Anion gap: 8 (ref 5–15)
BUN: 54 mg/dL — AB (ref 6–20)
CO2: 21 mmol/L — ABNORMAL LOW (ref 22–32)
CREATININE: 3.58 mg/dL — AB (ref 0.44–1.00)
Calcium: 10.2 mg/dL (ref 8.9–10.3)
Chloride: 111 mmol/L (ref 101–111)
GFR calc Af Amer: 17 mL/min — ABNORMAL LOW (ref >60)
GFR, EST NON AFRICAN AMERICAN: 14 mL/min — AB (ref >60)
Glucose, Bld: 142 mg/dL — ABNORMAL HIGH (ref 65–99)
POTASSIUM: 4.5 mmol/L (ref 3.5–5.1)
SODIUM: 140 mmol/L (ref 135–145)

## 2017-03-14 LAB — C3 COMPLEMENT: C3 Complement: 197 mg/dL — ABNORMAL HIGH (ref 82–167)

## 2017-03-14 LAB — CBC
HCT: 22.9 % — ABNORMAL LOW (ref 36.0–46.0)
Hemoglobin: 7.6 g/dL — ABNORMAL LOW (ref 12.0–15.0)
MCH: 28.9 pg (ref 26.0–34.0)
MCHC: 33.2 g/dL (ref 30.0–36.0)
MCV: 87.1 fL (ref 78.0–100.0)
PLATELETS: 260 10*3/uL (ref 150–400)
RBC: 2.63 MIL/uL — AB (ref 3.87–5.11)
RDW: 15.1 % (ref 11.5–15.5)
WBC: 12.2 10*3/uL — ABNORMAL HIGH (ref 4.0–10.5)

## 2017-03-14 LAB — RAPID URINE DRUG SCREEN, HOSP PERFORMED
AMPHETAMINES: NOT DETECTED
BARBITURATES: NOT DETECTED
BENZODIAZEPINES: NOT DETECTED
Cocaine: NOT DETECTED
Opiates: NOT DETECTED
Tetrahydrocannabinol: NOT DETECTED

## 2017-03-14 LAB — GLUCOSE, CAPILLARY
GLUCOSE-CAPILLARY: 152 mg/dL — AB (ref 65–99)
Glucose-Capillary: 104 mg/dL — ABNORMAL HIGH (ref 65–99)
Glucose-Capillary: 111 mg/dL — ABNORMAL HIGH (ref 65–99)
Glucose-Capillary: 227 mg/dL — ABNORMAL HIGH (ref 65–99)

## 2017-03-14 LAB — C4 COMPLEMENT: Complement C4, Body Fluid: 46 mg/dL — ABNORMAL HIGH (ref 14–44)

## 2017-03-14 MED ORDER — PREDNISONE 20 MG PO TABS
40.0000 mg | ORAL_TABLET | Freq: Every day | ORAL | Status: DC
  Administered 2017-03-14 – 2017-03-15 (×2): 40 mg via ORAL
  Filled 2017-03-14 (×2): qty 2

## 2017-03-14 NOTE — Progress Notes (Signed)
IR consulted for central catheter placement.  PA to room to discuss with patient this AM and RN had just placed a right forearm peripheral IV.  Discussed with Dr. Izola PriceMyers.  Will cancel order for central cathter at this time.  Team to re-consult if needed.   Loyce DysKacie Matthews, MMS RDN PA-C 11:48 AM

## 2017-03-14 NOTE — Care Management Note (Signed)
Case Management Note  Patient Details  Name: Pamela Goodwin MRN: [id number] Date of Birth: 01-May-1971  Subjective/Objective:   From home, presents with dyspnea and tachypnea,  aki,  Right foot pain, dm2, anemia , htn, leukocytosis, tachycardia, and obesity.              Action/Plan: NCM will follow for dc needs.   Expected Discharge Date:                  Expected Discharge Plan:     In-House Referral:     Discharge planning Services  CM Consult  Post Acute Care Choice:    Choice offered to:     DME Arranged:    DME Agency:     HH Arranged:    HH Agency:     Status of Service:  In process, will continue to follow  If discussed at Long Length of Stay Meetings, dates discussed:    Additional Comments:  Leone Havenaylor, Derricka Mertz [place], RN 03/14/2017, 6:41 PM

## 2017-03-14 NOTE — Progress Notes (Signed)
MD notified of fall.  

## 2017-03-14 NOTE — Progress Notes (Addendum)
Patient ID: Pamela Goodwin, female   DOB: 1971/05/01, 46 y.o.   MRN: [id number]    PROGRESS NOTE  Akshaya Toepfer  WUJ:[id number] DOB: [birth year] DOA: 03/11/2017  PCP: [name], MD   Brief Narrative:  46 y.o. female with known hypertension, diabetes, recently discharged from [facility] 03/10/2017 after being hospitalized for pancreatitis. Please note that patient does not know details of the exact cause of pancreatitis and is not reliable historian, always referring to her family at bedside to provide information. Patient presented to [facility] emergency department with main concern of progressively worsening dyspnea since the discharge. Patient says that she was short of breath in the hospital but thought it will eventually resolve however, her symptoms got worse and she started to experience dyspnea with exertion as well as dyspnea at rest. Patient also reports progressively worsening lower extremity swelling and weight gain of more than 20 pounds. She says that her baseline weight is around 150 pounds. Patient denies fevers and chills, no specific abdominal concerns. Patient denies chest pain. Please note that most of the history is provided by father at bedside.  ED Course: Patient noted to be tachypnea with respiratory rate in 30s however, oxygen saturation stable above 93% on room air. Blood work notable for WBC 15.8, hemoglobin 8.3, bicarbonate 16, creatinine 3.81. Chest x-ray notable for low lung volumes, questionable atelectasis, no evidence of pneumonia or fulminant edema.   Assessment & Plan:  Active Problems: Dyspnea and tachypnea  - Patient overall improving but etiology remains unclear - Per pulmonologist, suspect component of atelectasis, mild volume overload complicated by obesity and deconditioning - Pulmonary nodules to her bilateral R pleural-based and per pulmonologist this is encouraging as they may be benign such as in case of pleuro fibroelastosis's  - recommendation is to repeat  imaging studies CT chest or consider MRI chest in 3 months - Respiratory status is stable this morning with stable oxygen saturations  - due to tachycardia will transfer to telemetry bed today  Acute kidney injury with progression to ATN - Unknown baseline but suspect component of chronic kidney injury and could be related to underlying diabetes  - Renal ultrasound with no acute findings and it is suggestive of chronic medical renal disease - FENa > 3 - Continue to hold nephrotoxic medications including home medication metformin - Please see weight trend since admission below [facility] Weights   03/11/17 2102 03/12/17 0430 03/13/17 0432  Weight: 77.3 kg (170 lb 6.4 oz) 79.4 kg (175 lb 0.7 oz) 79 kg (174 lb 2.6 oz)  - Creatinine is starting to trend down which is encouraging  - We will repeat BMP in the morning  - Appreciate nephrology team assistance   Right foot pain - x-ray with no acute findings  - Uric acid elevated, gout is in differential  - Pain is still present but overall better  - I will start patient on low-dose prednisone to see if this will help   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 Etiology - anemia panel notable for stable iron level 41, TIBC 262  - No evidence of active bleeding  - CBC in the morning   HTN - Continue Norvasc   Leukocytosis - unclear etiology why WBC still elevated  - Patient with no fevers, urinalysis unremarkable  - No evidence of pneumonia on imaging studies  - Repeat CBC in the  morning   Tachycardia - Suspect related to pulmonary issues outlined above - Transfer patient to telemetry unit - keep on Metoprolol   Oobesity  - Body mass index is 31.44 kg/m.  DVT prophylaxis: Hep SQ Code Status: Full  Family Communication:  patient updated at bedside, I have spoke with father over the phone, discussed current  blood tests, results of x-ray of the foot, discussed elevated uric acid level and trial of oral prednisone to see if this will help. Father verbalized that his questions were answered to his satisfaction  Disposition Plan:  transfer to telemetry unit today with plan to discharge in next 24-48 hours   Consultants:   PCCM  Nephrology   Procedures:   None  Antimicrobials:   None  Subjective: Patient reports feeling better but still with exertional dyspnea   Objective: Vitals:   03/13/17 2351 03/14/17 0000 03/14/17 0300 03/14/17 0731  BP: (!) 133/95  (!) 135/96 136/87  Pulse: 91 81 (!) 101 93  Resp: 17 (!) 23 (!) 21 19  Temp: 98 F (36.7 C)  98.5 F (36.9 C) 97.9 F (36.6 C)  TempSrc: Oral  Oral Oral  SpO2: 97% 95% 97% 100%  Weight:      Height:        Intake/Output Summary (Last 24 hours) at 03/14/17 0938 Last data filed at 03/14/17 0816  Gross per 24 hour  Intake                0 ml  Output             2250 ml  Net            -2250 ml   Filed Weights   03/11/17 2102 03/12/17 0430 03/13/17 0432  Weight: 77.3 kg (170 lb 6.4 oz) 79.4 kg (175 lb 0.7 oz) 79 kg (174 lb 2.6 oz)    Physical Exam  Constitutional: Appears well-developed and well-nourished. No distress.  CVS: Tachycardic, S1/S2 +, no murmurs, no gallops, no carotid bruit.  Pulmonary: Effort and breath sounds normal, mild crackles at bases Abdominal: Soft. BS +,  no distension, tenderness, rebound or guarding.  Musculoskeletal: Normal range of motion. Trace bilateral lower extremity edema, tenderness to palpation in the right foot and ankle area  Lymphadenopathy: No lymphadenopathy noted, cervical, inguinal. Neuro: Alert. Normal reflexes, muscle tone coordination. No cranial nerve deficit. Skin: Skin is warm and dry. No rash noted. Not diaphoretic. No erythema. No pallor.   Data Reviewed: I have personally reviewed following labs and imaging studies  CBC:  Recent Labs Lab 03/11/17 1425 03/12/17 0100  03/12/17 1013 03/13/17 0311 03/14/17 0249  WBC 15.8* 13.7* 12.0* 12.0* 12.2*  HGB 8.3* 7.7* 7.3* 7.1* 7.6*  HCT 24.9* 23.8* 22.3* 22.0* 22.9*  MCV 87.4 86.5 87.1 88.0 87.1  PLT 297 262 244 248 260   Basic Metabolic Panel:  Recent Labs Lab 03/11/17 1425 03/12/17 0100 03/12/17 1013 03/13/17 0311 03/14/17 0249  NA 139  --  140 140 140  K 4.1  --  4.1 4.6 4.5  CL 112*  --  113* 113* 111  CO2 16*  --  18* 19* 21*  GLUCOSE 112*  --  184* 121* 142*  BUN 61*  --  59* 61* 54*  CREATININE 3.81* 3.92* 3.90* 4.03* 3.58*  CALCIUM 9.9  --  10.1 10.4* 10.2  PHOS  --   --   --  6.4*  --     Recent Labs Lab 03/11/17 1808 03/12/17  1013  LIPASE 108* 109*   Cardiac Enzymes:  Recent Labs Lab 03/12/17 0100 03/12/17 1013  TROPONINI <0.03 <0.03   HbA1C:  Recent Labs  03/11/17 1929  HGBA1C 6.4*   CBG:  Recent Labs Lab 03/12/17 2118 03/13/17 0752 03/13/17 1206 03/13/17 1631 03/13/17 2138  GLUCAP 133* 112* 104* 105* 114*   Thyroid Function Tests:  Recent Labs  03/12/17 0100  TSH 3.320   Urine analysis:    Component Value Date/Time   COLORURINE YELLOW 03/11/2017 1915   APPEARANCEUR HAZY (A) 03/11/2017 1915   LABSPEC 1.011 03/11/2017 1915   PHURINE 5.0 03/11/2017 1915   GLUCOSEU NEGATIVE 03/11/2017 1915   HGBUR SMALL (A) 03/11/2017 1915   BILIRUBINUR NEGATIVE 03/11/2017 1915   KETONESUR NEGATIVE 03/11/2017 1915   PROTEINUR 100 (A) 03/11/2017 1915   NITRITE NEGATIVE 03/11/2017 1915   LEUKOCYTESUR NEGATIVE 03/11/2017 1915    Recent Results (from the past 240 hour(s))  MRSA PCR Screening     Status: None   Collection Time: 03/11/17  9:04 PM  Result Value Ref Range Status   MRSA by PCR NEGATIVE NEGATIVE Final    Comment:        The GeneXpert MRSA Assay (FDA approved for NASAL specimens only), is one component of a comprehensive MRSA colonization surveillance program. It is not intended to diagnose MRSA infection nor to guide or monitor treatment  for MRSA infections.   Fungus culture, blood     Status: None (Preliminary result)   Collection Time: 03/12/17 10:13 AM  Result Value Ref Range Status   Specimen Description BLOOD RIGHT HAND  Final   Special Requests   Final    BOTTLES DRAWN AEROBIC ONLY Blood Culture adequate volume   Culture NO GROWTH < 24 HOURS  Final   Report Status PENDING  Incomplete  Culture, blood (Routine X 2) w Reflex to ID Panel     Status: None (Preliminary result)   Collection Time: 03/12/17 10:13 AM  Result Value Ref Range Status   Specimen Description BLOOD RIGHT HAND  Final   Special Requests   Final    BOTTLES DRAWN AEROBIC ONLY Blood Culture adequate volume   Culture NO GROWTH < 24 HOURS  Final   Report Status PENDING  Incomplete      Radiology Studies: Dg Foot Complete Right  Result Date: 03/13/2017 CLINICAL DATA:  Right foot pain with swelling. EXAM: RIGHT FOOT COMPLETE - 3+ VIEW COMPARISON:  None. FINDINGS: No fracture. No subluxation or dislocation. Hallux valgus deformity noted. IMPRESSION: Negative. Electronically Signed   By: Kennith Center M.D.   On: 03/13/2017 12:08    Scheduled Meds: . amLODipine  10 mg Oral Daily  . furosemide  20 mg Intravenous Once  . heparin  5,000 Units Subcutaneous Q8H  . insulin aspart  0-9 Units Subcutaneous TID WC  . levalbuterol  0.63 mg Nebulization BID  . metoprolol tartrate  25 mg Oral BID  . predniSONE  40 mg Oral Q breakfast   Continuous Infusions:   LOS: 3 days   Time spent: 82 Minutes   Debbora Presto, MD [facility] Pager [phone]  If 7PM-7AM, please contact night-coverage [email or web address] Password [facility] 03/14/2017, 9:38 AM

## 2017-03-14 NOTE — Progress Notes (Signed)
Patient ID: Pamela Goodwin, female   DOB: 02-Oct-1970, 46 y.o.   MRN: [id number] S:no complaints O:BP (!) 151/28 (BP Location: Left Arm)   Pulse 91   Temp 97.7 F (36.5 C) (Oral)   Resp 17   Ht 5' (1.524 m)   Wt 79 kg (174 lb 2.6 oz)   LMP 02/22/2017 (Exact Date)   SpO2 96%   BMI 34.01 kg/m   Intake/Output Summary (Last 24 hours) at 03/14/17 1423 Last data filed at 03/14/17 1125  Gross per 24 hour  Intake              240 ml  Output             1850 ml  Net            -1610 ml   Intake/Output: I/O last 3 completed shifts: In: 360 [P.O.:360] Out: 3100 [Urine:3100]  Intake/Output this shift:  Total I/O In: 240 [P.O.:240] Out: 750 [Urine:750] Weight change:  Gen:NAD CVS:no rub Resp:scattered rhonchi WUJ:[id number]:benign Ext:no edema   Recent Labs Lab 03/11/17 1425 03/12/17 0100 03/12/17 1013 03/13/17 0311 03/14/17 0249  NA 139  --  140 140 140  K 4.1  --  4.1 4.6 4.5  CL 112*  --  113* 113* 111  CO2 16*  --  18* 19* 21*  GLUCOSE 112*  --  184* 121* 142*  BUN 61*  --  59* 61* 54*  CREATININE 3.81* 3.92* 3.90* 4.03* 3.58*  ALBUMIN  --   --  2.6* 2.6*  --   CALCIUM 9.9  --  10.1 10.4* 10.2  PHOS  --   --   --  6.4*  --   AST  --   --  19  --   --   ALT  --   --  15  --   --    Liver Function Tests:  Recent Labs Lab 03/12/17 1013 03/13/17 0311  AST 19  --   ALT 15  --   ALKPHOS 34*  --   BILITOT 0.4  --   PROT 6.2*  --   ALBUMIN 2.6* 2.6*    Recent Labs Lab 03/11/17 1808 03/12/17 1013  LIPASE 108* 109*   No results for input(s): AMMONIA in the last 168 hours. CBC:  Recent Labs Lab 03/11/17 1425 03/12/17 0100 03/12/17 1013 03/13/17 0311 03/14/17 0249  WBC 15.8* 13.7* 12.0* 12.0* 12.2*  HGB 8.3* 7.7* 7.3* 7.1* 7.6*  HCT 24.9* 23.8* 22.3* 22.0* 22.9*  MCV 87.4 86.5 87.1 88.0 87.1  PLT 297 262 244 248 260   Cardiac Enzymes:  Recent Labs Lab 03/12/17 0100 03/12/17 1013  TROPONINI <0.03 <0.03   CBG:  Recent Labs Lab 03/12/17 2118  03/13/17 0752 03/13/17 1206 03/13/17 1631 03/13/17 2138  GLUCAP 133* 112* 104* 105* 114*    Iron Studies:  Recent Labs  03/13/17 0311  IRON 41  TIBC 262  FERRITIN 51   Studies/Results: Dg Foot Complete Right  Result Date: 03/13/2017 CLINICAL DATA:  Right foot pain with swelling. EXAM: RIGHT FOOT COMPLETE - 3+ VIEW COMPARISON:  None. FINDINGS: No fracture. No subluxation or dislocation. Hallux valgus deformity noted. IMPRESSION: Negative. Electronically Signed   By: Kennith CenterEric  Mansell M.D.   On: 03/13/2017 12:08   . amLODipine  10 mg Oral Daily  . heparin  5,000 Units Subcutaneous Q8H  . insulin aspart  0-9 Units Subcutaneous TID WC  . levalbuterol  0.63 mg Nebulization BID  . metoprolol tartrate  25 mg Oral BID  . predniSONE  40 mg Oral Q breakfast    BMET    Component Value Date/Time   NA 140 03/14/2017 0249   K 4.5 03/14/2017 0249   CL 111 03/14/2017 0249   CO2 21 (L) 03/14/2017 0249   GLUCOSE 142 (H) 03/14/2017 0249   BUN 54 (H) 03/14/2017 0249   CREATININE 3.58 (H) 03/14/2017 0249   CALCIUM 10.2 03/14/2017 0249   GFRNONAA 14 (L) 03/14/2017 0249   GFRAA 17 (L) 03/14/2017 0249   CBC    Component Value Date/Time   WBC 12.2 (H) 03/14/2017 0249   RBC 2.63 (L) 03/14/2017 0249   HGB 7.6 (L) 03/14/2017 0249   HCT 22.9 (L) 03/14/2017 0249   PLT 260 03/14/2017 0249   MCV 87.1 03/14/2017 0249   MCH 28.9 03/14/2017 0249   MCHC 33.2 03/14/2017 0249   RDW 15.1 03/14/2017 0249     Assessment/Plan:  1. AKI- unclear if she has underlying CKD as no records available and pt is a poor historian.  DDx includes prerenal azotemia, vs. ATN vs. CIN (not sure if she got IV contrasted study at [facility]).  Scr improved overnight.  No indication for dialysis.  Continue to follow 2. Dyspnea - improving and likely related to metabolic acidosis with AKI and metformin use.   3. Metabolic acidosis due to #1 and metformin.  Improving 4. Anemia- unclear if this is new or chronic.  Low  iron stores, will replete with feraheme and follow.  W/u per primary 5. DM- per primary 6. HTN- stable 7. Obesity 8. ?pancreatitis- resolved 9. Leukocytosis- WBC improving 10. Pulmonary nodules- w/u as an outpatient per PCCM  Irena Cords, MD [facility] [phone]

## 2017-03-14 NOTE — Progress Notes (Signed)
Patient had fall at shift change.  Pt was escorted to restroom and instructed to call for assistance when finished voiding.  Patient called for assistance, NT answered call bell and help patient to sink to wash hands.  Patient asked for hand lotion, NT turned to get hand lotion and patient fell.  No injuries noted.  BP 132/80, P 103, R 16, 96% on room air.  Patient refused socks earlier in day stating they made her feet hot and she was not going to wear them.  Patient escorted back to bed and bed alarm activated.  Importance of calling for assistance stressed to patient.  Patient verbalized understanding.

## 2017-03-15 ENCOUNTER — Inpatient Hospital Stay (HOSPITAL_BASED_OUTPATIENT_CLINIC_OR_DEPARTMENT_OTHER): Payer: Medicare Other

## 2017-03-15 DIAGNOSIS — I360 Nonrheumatic tricuspid (valve) stenosis: Secondary | ICD-10-CM | POA: Diagnosis not present

## 2017-03-15 LAB — BASIC METABOLIC PANEL
ANION GAP: 8 (ref 5–15)
BUN: 58 mg/dL — ABNORMAL HIGH (ref 6–20)
CALCIUM: 10 mg/dL (ref 8.9–10.3)
CO2: 23 mmol/L (ref 22–32)
CREATININE: 3.6 mg/dL — AB (ref 0.44–1.00)
Chloride: 109 mmol/L (ref 101–111)
GFR, EST AFRICAN AMERICAN: 17 mL/min — AB (ref >60)
GFR, EST NON AFRICAN AMERICAN: 14 mL/min — AB (ref >60)
GLUCOSE: 111 mg/dL — AB (ref 65–99)
Potassium: 4.2 mmol/L (ref 3.5–5.1)
Sodium: 140 mmol/L (ref 135–145)

## 2017-03-15 LAB — CBC
HCT: 22.8 % — ABNORMAL LOW (ref 36.0–46.0)
Hemoglobin: 7.3 g/dL — ABNORMAL LOW (ref 12.0–15.0)
MCH: 28.1 pg (ref 26.0–34.0)
MCHC: 32 g/dL (ref 30.0–36.0)
MCV: 87.7 fL (ref 78.0–100.0)
PLATELETS: 265 10*3/uL (ref 150–400)
RBC: 2.6 MIL/uL — ABNORMAL LOW (ref 3.87–5.11)
RDW: 15.1 % (ref 11.5–15.5)
WBC: 11.8 10*3/uL — AB (ref 4.0–10.5)

## 2017-03-15 LAB — ANTI-DNA ANTIBODY, DOUBLE-STRANDED: DS DNA AB: 1 [IU]/mL (ref 0–9)

## 2017-03-15 LAB — FUNGAL ANTIBODIES PANEL, ID-BLOOD
ASPERGILLUS NIGER: NEGATIVE
Aspergillus flavus: NEGATIVE
Aspergillus fumigatus, IgG: NEGATIVE
BLASTOMYCES ABS, QN, DID: NEGATIVE
HISTOPLASMA AB ID: NEGATIVE

## 2017-03-15 LAB — LIPASE, BLOOD: Lipase: 67 U/L — ABNORMAL HIGH (ref 11–51)

## 2017-03-15 LAB — GLUCOSE, CAPILLARY
GLUCOSE-CAPILLARY: 183 mg/dL — AB (ref 65–99)
Glucose-Capillary: 116 mg/dL — ABNORMAL HIGH (ref 65–99)

## 2017-03-15 LAB — ANCA TITERS
Atypical P-ANCA titer: <1:20 {titer}
P-ANCA: <1:20 {titer}

## 2017-03-15 MED ORDER — PREDNISONE 10 MG PO TABS: ORAL_TABLET | ORAL | 0 refills | Status: DC

## 2017-03-15 MED ORDER — GLIMEPIRIDE 4 MG PO TABS: 4.0000 mg | ORAL_TABLET | ORAL | 1 refills | Status: DC

## 2017-03-15 MED ORDER — LEVALBUTEROL HCL 0.63 MG/3ML IN NEBU: 0.6300 mg | INHALATION_SOLUTION | Freq: Four times a day (QID) | RESPIRATORY_TRACT | 1 refills | Status: DC | PRN

## 2017-03-15 MED ORDER — HYDROCODONE-ACETAMINOPHEN 5-325 MG PO TABS: 1.0000 | ORAL_TABLET | Freq: Four times a day (QID) | ORAL | 0 refills | Status: DC | PRN

## 2017-03-15 NOTE — Progress Notes (Signed)
  Echocardiogram 2D Echocardiogram has been performed.  Pamela Goodwin 03/15/2017, 4:00 PM

## 2017-03-15 NOTE — Evaluation (Signed)
Physical Therapy Evaluation Patient Details Name: Pamela Goodwin MRN: [id number] DOB: 1971/05/09 Today's Date: 03/15/2017   History of Present Illness   46 y/o F [facility] with complaints of lower extremity swelling / pain and shortness of breath with exertion. PMH: Disabled, prior TIA's, prior seizures, DM on metformin and HTN  Clinical Impression  Pt admitted with above diagnosis. Pt currently with functional limitations due to the deficits listed below (see PT Problem List). Presents with balance dysfunction with marked posterior lean; Fall risk; given presentation, I'm surprised she hasn't fallen at home; Rec Outpt PT follow up for gait, balance, and vestibular rehab; notified case Mgr;  Pt will benefit from skilled PT to increase their independence and safety with mobility to allow discharge to the venue listed below.       Follow Up Recommendations Outpatient PT (for gait and balance dysfunction)    Equipment Recommendations  Rolling walker with 5" wheels    Recommendations for Other Services OT consult     Precautions / Restrictions Precautions Precautions: Fall      Mobility  Bed Mobility               General bed mobility comments: OOB in chair upon arrival  Transfers Overall transfer level: Needs assistance Equipment used: None Transfers: Sit to/from Stand Sit to Stand: Min guard;Mod assist         General transfer comment: Able to power up to stand, however, once up had significant posterior lean and reached out and grabbed for UE support to prevent fall; mod handheld assist to steady and prevent loss of balance posteriorly  Ambulation/Gait Ambulation/Gait assistance: Min guard (with and without physical contact) Ambulation Distance (Feet): 100 Feet Assistive device: Rolling walker (2 wheeled) Gait Pattern/deviations: Decreased step length - right;Decreased step length - left   Gait velocity interpretation: Below normal speed for  age/gender General Gait Details: Cues to use RW for support, work to keep weight anterior, and to incr step length  Stairs            Wheelchair Mobility    Modified Rankin (Stroke Patients Only)       Balance Overall balance assessment: Needs assistance           Standing balance-Leahy Scale: Poor Standing balance comment: psoterior lean; worked on wall bumps                             Pertinent Vitals/Pain Pain Assessment: No/denies pain    Home Living Family/patient expects to be discharged to:: Private residence Living Arrangements: Parent (dad and brother) Available Help at Discharge: Available PRN/intermittently Type of Home: House Home Access: Level entry     Home Layout: Two level;Able to live on main level with bedroom/bathroom        Prior Function Level of Independence: Independent         Comments: reports no history of falls     Hand Dominance        Extremity/Trunk Assessment   Upper Extremity Assessment Upper Extremity Assessment: Defer to OT evaluation    Lower Extremity Assessment Lower Extremity Assessment: Generalized weakness       Communication   Communication: No difficulties  Cognition Arousal/Alertness: Awake/alert Behavior During Therapy: WFL for tasks assessed/performed Overall Cognitive Status: Within Functional Limits for tasks assessed  General Comments      Exercises     Assessment/Plan    PT Assessment Patient needs continued PT services  PT Problem List Decreased activity tolerance;Decreased balance;Decreased mobility;Decreased coordination;Decreased knowledge of use of DME;Decreased safety awareness;Decreased knowledge of precautions       PT Treatment Interventions DME instruction;Gait training;Stair training;Functional mobility training;Therapeutic activities;Therapeutic exercise;Balance training;Neuromuscular  re-education;Patient/family education    PT Goals (Current goals can be found in the Care Plan section)  Acute Rehab PT Goals Patient Stated Goal: get better PT Goal Formulation: With patient Time For Goal Achievement: 03/29/17 Potential to Achieve Goals: Good    Frequency Min 3X/week   Barriers to discharge        Co-evaluation               AM-PAC PT "6 Clicks" Daily Activity  Outcome Measure Difficulty turning over in bed (including adjusting bedclothes, sheets and blankets)?: None Difficulty moving from lying on back to sitting on the side of the bed? : None Difficulty sitting down on and standing up from a chair with arms (e.g., wheelchair, bedside commode, etc,.)?: Total Help needed moving to and from a bed to chair (including a wheelchair)?: A Little Help needed walking in hospital room?: A Little Help needed climbing 3-5 steps with a railing? : A Lot 6 Click Score: 17    End of Session Equipment Utilized During Treatment: Gait belt Activity Tolerance: Patient tolerated treatment well Patient left: in chair;with call bell/phone within reach Nurse Communication: Mobility status PT Visit Diagnosis: Unsteadiness on feet (R26.81);Other abnormalities of gait and mobility (R26.89)    Time: 1610-9604 PT Time Calculation (min) (ACUTE ONLY): 26 min   Charges:   PT Evaluation $PT Eval Moderate Complexity: 1 Procedure PT Treatments $Gait Training: 8-22 mins   PT G Codes:        Pamela Goodwin, PT  Acute Rehabilitation Services Pager [phone] Office [phone]   Levi Aland 03/15/2017, 2:36 PM

## 2017-03-15 NOTE — Progress Notes (Signed)
Pamela Goodwin to be D/C'd Home per MD order.  Discussed prescriptions and follow up appointments with the patient. Prescriptions given to patient, medication list explained in detail. Pt verbalized understanding.  Allergies as of 03/15/2017      Reactions   Shellfish Allergy Anaphylaxis   Angioedema also   Hydralazine Other (See Comments)   Patient "zoned out" and caused extreme lethargy (??)      Medication List    STOP taking these medications   gabapentin 300 MG capsule Commonly known as:  NEURONTIN   levofloxacin 250 MG tablet Commonly known as:  LEVAQUIN   metFORMIN 500 MG tablet Commonly known as:  GLUCOPHAGE     TAKE these medications   amLODipine 10 MG tablet Commonly known as:  NORVASC Take 10 mg by mouth daily.   FLONASE 50 MCG/ACT nasal spray Generic drug:  fluticasone Place 1 spray into both nostrils daily as needed for allergies or rhinitis.   glimepiride 4 MG tablet Commonly known as:  AMARYL Take 1 tablet (4 mg total) by mouth every morning.   HYDROcodone-acetaminophen 5-325 MG tablet Commonly known as:  NORCO/VICODIN Take 1-2 tablets by mouth every 6 (six) hours as needed for moderate pain or severe pain.   levalbuterol 0.63 MG/3ML nebulizer solution Commonly known as:  XOPENEX Take 3 mLs (0.63 mg total) by nebulization every 6 (six) hours as needed for wheezing or shortness of breath.   methocarbamol 500 MG tablet Commonly known as:  ROBAXIN Take 500 mg by mouth every 8 (eight) hours as needed for muscle spasms.   metoprolol tartrate 25 MG tablet Commonly known as:  LOPRESSOR Take 25 mg by mouth 2 (two) times daily.   PAZEO 0.7 % Soln Generic drug:  Olopatadine HCl Place 1 drop into both eyes daily as needed (for irritation).   predniSONE 10 MG tablet Commonly known as:  DELTASONE Take 40 mg tablet 7/11 and taper down by 10 mg daily until completed   ROBITUSSIN SUGAR FREE 10-100 MG/5ML liquid Generic drug:  Dextromethorphan-Guaifenesin Take  5 mLs by mouth every 12 (twelve) hours as needed (for cough/congestion).   Vitamin D3 50000 units Caps Take 50,000 Units by mouth every 7 (seven) days.       Vitals:   03/15/17 0420 03/15/17 1000  BP: (!) 142/85 138/82  Pulse: (!) 104 91  Resp: 18 18  Temp: (!) 97.3 F (36.3 C) 98.2 F (36.8 C)    Skin clean, dry and intact without evidence of skin break down, no evidence of skin tears noted. IV catheter discontinued intact. Site without signs and symptoms of complications. Dressing and pressure applied. Pt denies pain at this time. No complaints noted.  An After Visit Summary was printed and given to the patient. Patient escorted via WC, and D/C home via private auto.  Nelma RothmanNatalie Shahida Schnackenberg, RN [facility] Phone [phone]

## 2017-03-15 NOTE — Discharge Summary (Addendum)
Physician Discharge Summary  Pamela Goodwin ZOX:[id number] DOB: [birth year] DOA: 03/11/2017  PCP: Pamela Peri, MD  Admit date: 03/11/2017 Discharge date: 03/15/2017  Recommendations for Outpatient Follow-up:  1. Pt will need to follow up with PCP in 1 week 2. Please obtain BMP to evaluate electrolytes and kidney function 3. Please also check CBC to evaluate Hg and Hct levels 4. Stop Metformin and Gabapentin until renal function stabilizes, Pt started on Glimepiride 5. Pulmonary nodules to her bilateral R pleural-based and per pulmonologist this is encouraging as they may be benign such as in case of pleuro fibroelastosis's. Recommendation is to repeat imaging studies CT chest or consider MRI chest in 3 months 6. Prednisone taper initiated for gout treatment   Discharge Diagnoses:  Active Problems:   Acute renal failure (ARF) (HCC)  Discharge Condition: Stable  Diet recommendation: Heart healthy diet discussed in details   Brief Narrative:  46 y.o.femalewith known hypertension, diabetes, recently discharged from [facility] 03/10/2017 after being hospitalized for pancreatitis. Please note that patient does not know details of the exact cause of pancreatitis and is not reliable historian, always referring to her family at bedside to provide information. Patient presented to [facility] emergency department with main concern of progressively worsening dyspnea since the discharge. Patient says that she was short of breath in the hospital but thought it will eventually resolve however, her symptoms got worse and she started to experience dyspnea with exertion as well as dyspnea at rest. Patient also reports progressively worsening lower extremity swelling and weight gain of more than 20 pounds. She says that her baseline weight is around 150 pounds. Patient denies fevers and chills, no specific abdominal concerns. Patient denies chest pain. Please note that most of the history is provided by father at  bedside.  ED Course:Patient noted to be tachypnea with respiratory rate in 30s however, oxygen saturation stable above 93% on room air. Blood work notable for WBC 15.8, hemoglobin 8.3, bicarbonate 16, creatinine 3.81. Chest x-ray notable for low lung volumes, questionable atelectasis, no evidence of pneumonia or fulminant edema.   Assessment & Plan:  Active Problems: Dyspnea and tachypnea  - Patient overall improving but etiology remains unclear - Per pulmonologist, suspect component of atelectasis, mild volume overload complicated by obesity and deconditioning - Pulmonary nodules to her bilateral R pleural-based and per pulmonologist this is encouraging as they may be benign such as in case of pleuro fibroelastosis's  - recommendation is to repeat imaging studies CT chest or consider MRI chest in 3 months - Respiratory status is stable this morning with stable oxygen saturations   Acute kidney injury with progression to ATN - Unknown baseline but suspect component of chronic kidney injury and could be related to underlying diabetes  - Renal ultrasound with no acute findings and it is suggestive of chronic medical renal disease - FENa > 3 - Continue to hold nephrotoxic medications including home medication metformin - Please see weight trend since admission below      [facility] Weights   03/11/17 2102 03/12/17 0430 03/13/17 0432  Weight: 77.3 kg (170 lb 6.4 oz) 79.4 kg (175 lb 0.7 oz) 79 kg (174 lb 2.6 oz)  - Creatinine is starting to trend down which is encouraging  - no further recommendations per nephrologist   Right foot pain - x-ray with no acute findings  - Uric acid elevated, gout is in differential  - pt started on Prednisone taper and responding well   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  DVT prophylaxis: Hep SQ Code Status: Full  Family Communication:  patient updated at bedside, I have spoke with father over the phone  Disposition Plan:  pt wants to be discharged today, nephrologist cleared for d/c  Consultants:   PCCM  Nephrology   Procedures:   None  Antimicrobials:   None  Procedures/Studies: Dg Chest 2 View  Result Date: 03/11/2017 CLINICAL DATA:  Acute shortness of breath. EXAM: CHEST  2 VIEW COMPARISON:  None. FINDINGS: This is a low volume film. Upper limits normal heart size noted. Bibasilar opacities are identified, favor atelectasis over airspace disease. There is no evidence of pneumothorax or pleural effusion. No bony abnormalities are present. IMPRESSION: Low volume film with bibasilar opacities -favor atelectasis. Upper limits normal heart size. Electronically Signed   By: Harmon Pier M.D.   On: 03/11/2017 15:50   Ct Chest Wo Contrast  Result Date: 03/11/2017 CLINICAL DATA:  Dyspnea and shortness breath intermittently for the last few days. EXAM: CT CHEST WITHOUT CONTRAST TECHNIQUE: Multidetector CT imaging of the chest was performed following the standard protocol without IV contrast. COMPARISON:  03/11/2017 FINDINGS: Body habitus reduces diagnostic sensitivity and specificity. Cardiovascular: Mild cardiomegaly. Mild atherosclerotic calcification in the left anterior descending coronary artery. Trace pericardial effusion. Mediastinum/Nodes: Poor definition of fat planes around the trachea and mainstem bronchi of uncertain significance. No overt pathologic paratracheal or AP  window adenopathy. Fullness of both hila, difficult to exclude hilar or infrahilar adenopathy. Lungs/Pleura: Abnormal nodularity in the lung bases. For example, there is a 1.9 by 1.2 cm nodule along the right lower lobe side of the right major fissure on image 62/4. A subpleural right lower lobe nodule measuring 0.6 by 0.6 cm is present on image 66/4. If anteriorly in the left lower lobe, a 1.5 by 1.3 cm nodular density on image 65/4 is obscured by surrounding bandlike atelectasis. Similarly there is additional nodularity scattered along the left inferior pulmonary ligament and in the subpleural locations of the left lower lobe. This process seems to favor the lower lobes. Low lung volumes are present and there is mosaic attenuation in the lower lobes. With regard to the airways, I do not see peribronchovascular nodularity along the more peripheral airways. Upper Abdomen: Unremarkable Musculoskeletal: Unremarkable IMPRESSION: 1. Abnormal lung nodules in the lung bases with scattered atelectasis. Surrounding ground-glass opacities and mosaic attenuation also favoring the lung bases. Possibilities may include sarcoidosis, fungal disease, or atypical infectious process. Some of these nodules are along pleural surfaces, but I do not see definite paratracheal adenopathy or distal peribronchovascular nodularity to further suggest sarcoidosis. There is potentially infrahilar adenopathy bilaterally, although this is difficult to separate out from the vessels given the lack of IV contrast. Follow up imaging to ensure resolution is recommended to exclude the unlikely possibility of malignancy. 2. Athero left anterior descending coronary atherosclerosis and mild cardiomegaly. 3. Small pericardial effusion. 4. Low lung volumes. Electronically  Signed   By: Gaylyn Rong M.D.   On: 03/11/2017 19:29   US Renal  Result Date: 03/11/2017 CLINICAL DATA:  Acute renal failure. EXAM: RENAL / URINARY TRACT ULTRASOUND COMPLETE  COMPARISON:  None. FINDINGS: Right Kidney: Length: 9 cm. Increased renal cortical echogenicity. No hydronephrosis visualized. No visualized mass. Left Kidney: Length: 9.5 cm. Increased renal cortical echogenicity. No hydronephrosis visualized. No visualized mass. Bladder: Appears normal for degree of bladder distention. IMPRESSION: Increased renal echogenicity consistent with chronic medical renal disease. Electronically Signed   By: Rubye Oaks M.D.   On: 03/11/2017 20:07   Dg Foot Complete Right  Result Date: 03/13/2017 CLINICAL DATA:  Right foot pain with swelling. EXAM: RIGHT FOOT COMPLETE - 3+ VIEW COMPARISON:  None. FINDINGS: No fracture. No subluxation or dislocation. Hallux valgus deformity noted. IMPRESSION: Negative. Electronically Signed   By: Kennith Center M.D.   On: 03/13/2017 12:08   Discharge Exam: Vitals:   03/14/17 2035 03/15/17 0420  BP: 132/80 (!) 142/85  Pulse: (!) 103 (!) 104  Resp: 16 18  Temp: 98 F (36.7 C) (!) 97.3 F (36.3 C)   Vitals:   03/14/17 1930 03/14/17 2035 03/15/17 0420 03/15/17 0900  BP: 132/80 132/80 (!) 142/85   Pulse: (!) 103 (!) 103 (!) 104   Resp: 16 16 18    Temp: 98 F (36.7 C) 98 F (36.7 C) (!) 97.3 F (36.3 C)   TempSrc: Oral Oral Oral   SpO2: 96% 96% 100% 99%  Weight:      Height:        General: Pt is alert, follows commands appropriately, not in acute distress Cardiovascular: Regular rate and rhythm, S1/S2 +, no murmurs, no rubs, no gallops Respiratory: Clear to auscultation bilaterally, no wheezing, no crackles, no rhonchi Abdominal: Soft, non tender, non distended, bowel sounds +, no guarding Extremities: no edema, no cyanosis, pulses palpable bilaterally DP and PT  Discharge Instructions   Allergies as of 03/15/2017      Reactions   Shellfish Allergy Anaphylaxis   Angioedema also   Hydralazine Other (See Comments)   Patient "zoned out" and caused extreme lethargy (??)      Medication List    STOP taking these  medications   gabapentin 300 MG capsule Commonly known as:  NEURONTIN   levofloxacin 250 MG tablet Commonly known as:  LEVAQUIN   metFORMIN 500 MG tablet Commonly known as:  GLUCOPHAGE     TAKE these medications   amLODipine 10 MG tablet Commonly known as:  NORVASC Take 10 mg by mouth daily.   FLONASE 50 MCG/ACT nasal spray Generic drug:  fluticasone Place 1 spray into both nostrils daily as needed for allergies or rhinitis.   glimepiride 4 MG tablet Commonly known as:  AMARYL Take 1 tablet (4 mg total) by mouth every morning.   HYDROcodone-acetaminophen 5-325 MG tablet Commonly known as:  NORCO/VICODIN Take 1-2 tablets by mouth every 6 (six) hours as needed for moderate pain or severe pain.   levalbuterol 0.63 MG/3ML nebulizer solution Commonly known as:  XOPENEX Take 3 mLs (0.63 mg total) by nebulization every 6 (six) hours as needed for wheezing or shortness of breath.   methocarbamol 500 MG tablet Commonly known as:  ROBAXIN Take 500 mg by mouth every 8 (eight) hours as needed for muscle spasms.   metoprolol tartrate 25 MG tablet Commonly known as:  LOPRESSOR Take 25 mg by mouth 2 (two) times daily.   PAZEO 0.7 % Soln Generic drug:  Olopatadine HCl Place 1 drop into both eyes daily as needed (for irritation).   predniSONE 10 MG tablet Commonly known as:  DELTASONE Take 40 mg tablet 7/11 and taper down by 10 mg daily until completed   ROBITUSSIN SUGAR FREE 10-100 MG/5ML liquid Generic drug:  Dextromethorphan-Guaifenesin Take 5 mLs by mouth every 12 (twelve) hours as needed (for cough/congestion).   Vitamin D3 50000 units Caps Take 50,000 Units by mouth every 7 (seven) days.      Follow-up Information    Pamela PeriShah, Ashish, MD Follow up.   Specialty:  Internal Medicine Contact information: [address] [place] [id number] [phone]            The results of significant diagnostics from this hospitalization (including imaging, microbiology, ancillary  and laboratory) are listed below for reference.     Microbiology: Recent Results (from the past 240 hour(s))  MRSA PCR Screening     Status: None   Collection Time: 03/11/17  9:04 PM  Result Value Ref Range Status   MRSA by PCR NEGATIVE NEGATIVE Final    Comment:        The GeneXpert MRSA Assay (FDA approved for NASAL specimens only), is one component of a comprehensive MRSA colonization surveillance program. It is not intended to diagnose MRSA infection nor to guide or monitor treatment for MRSA infections.   Fungus culture, blood     Status: None (Preliminary result)   Collection Time: 03/12/17 10:13 AM  Result Value Ref Range Status   Specimen Description BLOOD RIGHT HAND  Final   Special Requests   Final    BOTTLES DRAWN AEROBIC ONLY Blood Culture adequate volume   Culture NO GROWTH 3 DAYS  Final   Report Status PENDING  Incomplete  Culture, blood (Routine X 2) w Reflex to ID Panel     Status: None (Preliminary result)   Collection Time: 03/12/17 10:13 AM  Result Value Ref Range Status   Specimen Description BLOOD RIGHT HAND  Final   Special Requests   Final    BOTTLES DRAWN AEROBIC ONLY Blood Culture adequate volume   Culture NO GROWTH 3 DAYS  Final   Report Status PENDING  Incomplete     Labs: Basic Metabolic Panel:  Recent Labs Lab 03/11/17 1425 03/12/17 0100 03/12/17 1013 03/13/17 0311 03/14/17 0249 03/15/17 0522  NA 139  --  140 140 140 140  K 4.1  --  4.1 4.6 4.5 4.2  CL 112*  --  113* 113* 111 109  CO2 16*  --  18* 19* 21* 23  GLUCOSE 112*  --  184* 121* 142* 111*  BUN 61*  --  59* 61* 54* 58*  CREATININE 3.81* 3.92* 3.90* 4.03* 3.58* 3.60*  CALCIUM 9.9  --  10.1 10.4* 10.2 10.0  PHOS  --   --   --  6.4*  --   --    Liver Function Tests:  Recent Labs Lab 03/12/17 1013 03/13/17 0311  AST 19  --   ALT 15  --   ALKPHOS 34*  --   BILITOT 0.4  --   PROT 6.2*  --   ALBUMIN 2.6* 2.6*    Recent Labs Lab 03/11/17 1808 03/12/17 1013  03/15/17 0522  LIPASE 108* 109* 67*    CBC:  Recent Labs Lab 03/12/17 0100 03/12/17 1013 03/13/17 0311 03/14/17 0249 03/15/17 0522  WBC 13.7* 12.0* 12.0* 12.2* 11.8*  HGB 7.7* 7.3* 7.1* 7.6* 7.3*  HCT 23.8* 22.3* 22.0* 22.9* 22.8*  MCV 86.5 87.1 88.0 87.1 87.7  PLT 262 244 248 260 265   Cardiac Enzymes:  Recent Labs Lab 03/12/17 0100 03/12/17 1013  TROPONINI <0.03 <0.03   BNP: BNP (last 3 results)  Recent Labs  03/11/17 1425  BNP 135.6*    CBG:  Recent Labs Lab 03/14/17 0728 03/14/17 1148 03/14/17 1605 03/14/17 2032 03/15/17 0857  GLUCAP 104* 111* 227* 152* 116*   SIGNED: Time coordinating discharge: 50 minutes  Debbora Presto, MD  [facility] 03/15/2017, 10:23 AM Pager [phone]  If 7PM-7AM, please contact night-coverage [email or web address] Password [email or web address]

## 2017-03-15 NOTE — Discharge Instructions (Signed)
Acute Kidney Injury, Adult Acute kidney injury is a sudden worsening of kidney function. The kidneys are organs that have several jobs. They filter the blood to remove waste products and extra fluid. They also maintain a healthy balance of minerals and hormones in the body, which helps control blood pressure and keep bones strong. With this condition, your kidneys do not do their jobs as well as they should. This condition ranges from mild to severe. Over time it may develop into long-lasting (chronic) kidney disease. Early detection and treatment may prevent acute kidney injury from developing into a chronic condition. What are the causes? Common causes of this condition include:  A problem with blood flow to the kidneys. This may be caused by:  Low blood pressure (hypotension) or shock.  Blood loss.  Heart and blood vessel (cardiovascular) disease.  Severe burns.  Liver disease.  Direct damage to the kidneys. This may be caused by:  Certain medicines.  A kidney infection.  Poisoning.  Being around or in contact with toxic substances.  A surgical wound.  A hard, direct hit to the kidney area.  A sudden blockage of urine flow. This may be caused by:  Cancer.  Kidney stones.  An enlarged prostate in males. What are the signs or symptoms? Symptoms of this condition may not be obvious until the condition becomes severe. Symptoms of this condition can include:  Tiredness (lethargy), or difficulty staying awake.  Nausea or vomiting.  Swelling (edema) of the face, legs, ankles, or feet.  Problems with urination, such as:  Abdominal pain, or pain along the side of your stomach (flank).  Decreased urine production.  Decrease in the force of urine flow.  Muscle twitches and cramps, especially in the legs.  Confusion or trouble concentrating.  Loss of appetite.  Fever. How is this diagnosed? This condition may be diagnosed with tests, including:  Blood  tests.  Urine tests.  Imaging tests.  A test in which a sample of tissue is removed from the kidneys to be examined under a microscope (kidney biopsy). How is this treated? Treatment for this condition depends on the cause and how severe the condition is. In mild cases, treatment may not be needed. The kidneys may heal on their own. In more severe cases, treatment will involve:  Treating the cause of the kidney injury. This may involve changing any medicines you are taking or adjusting your dosage.  Fluids. You may need specialized IV fluids to balance your body's needs.  Having a catheter placed to drain urine and prevent blockages.  Preventing problems from occurring. This may mean avoiding certain medicines or procedures that can cause further injury to the kidneys. In some cases treatment may also require:  A procedure to remove toxic wastes from the body (dialysis or continuous renal replacement therapy - CRRT).  Surgery. This may be done to repair a torn kidney, or to remove the blockage from the urinary system. Follow these instructions at home: Medicines   Take over-the-counter and prescription medicines only as told by your health care provider.  Do not take any new medicines without your health care provider's approval. Many medicines can worsen your kidney damage.  Do not take any vitamin and mineral supplements without your health care provider's approval. Many nutritional supplements can worsen your kidney damage. Lifestyle   If your health care provider prescribed changes to your diet, follow them. You may need to decrease the amount of protein you eat.  Achieve and maintain a   healthy weight. If you need help with this, ask your health care provider.  Start or continue an exercise plan. Try to exercise at least 30 minutes a day, 5 days a week.  Do not use any tobacco products, such as cigarettes, chewing tobacco, and e-cigarettes. If you need help quitting, ask  your health care provider. General instructions   Keep track of your blood pressure. Report changes in your blood pressure as told by your health care provider.  Stay up to date with immunizations. Ask your health care provider which immunizations you need.  Keep all follow-up visits as told by your health care provider. This is important. Where to find more information:  American Association of Kidney Patients: www.aakp.org  National Kidney Foundation: www.kidney.org  American Kidney Fund: www.akfinc.org  Life Options Rehabilitation Program:  www.lifeoptions.org  www.kidneyschool.org Contact a health care provider if:  Your symptoms get worse.  You develop new symptoms. Get help right away if:  You develop symptoms of worsening kidney disease, which include:  Headaches.  Abnormally dark or light skin.  Easy bruising.  Frequent hiccups.  Chest pain.  Shortness of breath.  End of menstruation in women.  Seizures.  Confusion or altered mental status.  Abdominal or back pain.  Itchiness.  You have a fever.  Your body is producing less urine.  You have pain or bleeding when you urinate. Summary  Acute kidney injury is a sudden worsening of kidney function.  Acute kidney injury can be caused by problems with blood flow to the kidneys, direct damage to the kidneys, and sudden blockage of urine flow.  Symptoms of this condition may not be obvious until it becomes severe. Symptoms may include edema, lethargy, confusion, nausea or vomiting, and problems passing urine.  This condition can usually be diagnosed with blood tests, urine tests, and imaging tests. Sometimes a kidney biopsy is done to diagnose this condition.  Treatment for this condition often involves treating the underlying cause. It is treated with fluids, medicines, dialysis, diet changes, or surgery. This information is not intended to replace advice given to you by your health care provider.  Make sure you discuss any questions you have with your health care provider. Document Released: 03/08/2011 Document Revised: 08/13/2016 Document Reviewed: 08/13/2016 Elsevier Interactive Patient Education  2017 Elsevier Inc.  

## 2017-03-15 NOTE — Care Management Important Message (Signed)
Important Message  Patient Details  Name: Pamela Goodwin MRN: [id number] Date of Birth: 1970-12-21   Medicare Important Message Given:  Yes    Rhetta Cleek 03/15/2017, 1:28 PM

## 2017-03-15 NOTE — Care Management Note (Addendum)
Case Management Note  Patient Details  Name: Pamela Goodwin MRN: [id number] Date of Birth: 1971-01-28  Subjective/Objective:     CM following for progression and d/c planning.                Action/Plan: Outpatient PT recommended by PT eval. This CM discussed with pt and her father who provides her transportation. [place], requesting PT closer to that location. Outpatient PT for balance and gait disfunction arranged for pt at [facility] Outpatient PT department. Appointment scheduled for March 22, 2017 @ 1:30pm. Will call pt at home to inform of this appointment as she has d/c prior to this order being completed.  4:30pm Message left on pt home message machine with appointment date and time @ [facility].   Expected Discharge Date:  03/15/17               Expected Discharge Plan:  Home/Self Care  In-House Referral:  NA  Discharge planning Services  CM Consult  Post Acute Care Choice:    Choice offered to:  Patient  DME Arranged:   NA DME Agency:   NA  HH Arranged:   NA HH Agency:   NA  Status of Service:  Completed, signed off  If discussed at Long Length of Stay Meetings, dates discussed:    Additional Comments:  Starlyn SkeansRoyal, Pamela Essick Goodwin, Pamela Goodwin 03/15/2017, 4:28 PM

## 2017-03-15 NOTE — Progress Notes (Signed)
S: Feels well.  Says she was told she will be DC'd O:BP (!) 142/85 (BP Location: Right Arm)   Pulse (!) 104   Temp (!) 97.3 F (36.3 C) (Oral)   Resp 18   Ht 5' (1.524 m)   Wt 79 kg (174 lb 2.6 oz)   LMP 02/22/2017 (Exact Date)   SpO2 99%   BMI 34.01 kg/m   Intake/Output Summary (Last 24 hours) at 03/15/17 1006 Last data filed at 03/15/17 0422  Gross per 24 hour  Intake              220 ml  Output             1050 ml  Net             -830 ml   Weight change:  ZOX:WRUEAGen:Awake and alert CVS:RRR Resp:decreased BS bases Abd:+ BS NTND Ext:No edema NEURO:CNI Ox3, no asterixis   . amLODipine  10 mg Oral Daily  . heparin  5,000 Units Subcutaneous Q8H  . insulin aspart  0-9 Units Subcutaneous TID WC  . levalbuterol  0.63 mg Nebulization BID  . metoprolol tartrate  25 mg Oral BID  . predniSONE  40 mg Oral Q breakfast   Dg Foot Complete Right  Result Date: 03/13/2017 CLINICAL DATA:  Right foot pain with swelling. EXAM: RIGHT FOOT COMPLETE - 3+ VIEW COMPARISON:  None. FINDINGS: No fracture. No subluxation or dislocation. Hallux valgus deformity noted. IMPRESSION: Negative. Electronically Signed   By: Kennith CenterEric  Mansell M.D.   On: 03/13/2017 12:08   BMET    Component Value Date/Time   NA 140 03/15/2017 0522   K 4.2 03/15/2017 0522   CL 109 03/15/2017 0522   CO2 23 03/15/2017 0522   GLUCOSE 111 (H) 03/15/2017 0522   BUN 58 (H) 03/15/2017 0522   CREATININE 3.60 (H) 03/15/2017 0522   CALCIUM 10.0 03/15/2017 0522   GFRNONAA 14 (L) 03/15/2017 0522   GFRAA 17 (L) 03/15/2017 0522   CBC    Component Value Date/Time   WBC 11.8 (H) 03/15/2017 0522   RBC 2.60 (L) 03/15/2017 0522   HGB 7.3 (L) 03/15/2017 0522   HCT 22.8 (L) 03/15/2017 0522   PLT 265 03/15/2017 0522   MCV 87.7 03/15/2017 0522   MCH 28.1 03/15/2017 0522   MCHC 32.0 03/15/2017 0522   RDW 15.1 03/15/2017 0522     Assessment:  1. ARF ? Etiology.  Scr stable, UO good.  Unclear what her baseline is.  Renal US shows  increased echogenicity suggesting CKD 2. Metabolic acidosis sec ARF and metformin, improved 3. DM 4. HTN 5. Anemia  Plan: 1. Renal fx has been stable here since 03/11/17 so if plans are for DC that should be OK but she will need F[place].  [facility] see what Scr was during that time.  Paged Dr Izola PriceMyers x 2 to discuss, no return call yet   Reynald Woods T

## 2017-03-16 ENCOUNTER — Telehealth: Payer: Self-pay | Admitting: *Deleted

## 2017-03-16 DIAGNOSIS — R911 Solitary pulmonary nodule: Secondary | ICD-10-CM

## 2017-03-16 LAB — GLOMERULAR BASEMENT MEMBRANE ANTIBODIES: GBM AB: 3 U (ref 0–20)

## 2017-03-16 LAB — ECHOCARDIOGRAM COMPLETE
HEIGHTINCHES: 60 in
WEIGHTICAEL: 2786.61 [oz_av]

## 2017-03-16 NOTE — Telephone Encounter (Signed)
-----   Message from Pamela CrazeBrandi L Ollis, NP sent at 03/12/2017  5:11 PM EDT ----- Regarding: Follow up appt Pamela AbedLindsay,   Will you please arrange for a follow up appointment for Pamela Goodwin in the office 1-3 months with a repeat CT of the chest (non-contrast) prior to being seen?  She is currently inpatient, this will have to be set up post discharge.  Please call me if you have any questions.    Thank you,  Pamela ProudBrandi

## 2017-03-16 NOTE — Telephone Encounter (Signed)
Spoke with pt and her father. Pt has been scheduled to see BQ on 05/26/2017 at 1:30pm. Order has been placed for CT. Nothing further was needed.

## 2017-03-17 ENCOUNTER — Ambulatory Visit (HOSPITAL_COMMUNITY): Payer: Medicare Other | Admitting: Physical Therapy

## 2017-03-17 LAB — CULTURE, BLOOD (ROUTINE X 2)
CULTURE: NO GROWTH
Special Requests: ADEQUATE

## 2017-03-17 NOTE — Progress Notes (Signed)
Late entry:  Patient admitted to the unit from 34M. Patient alert and oriented conversing with staff.  Patient made comfortable in bed and room. Introductions were made from this RN and Chermaine NT. Vital signs completed, patient oriented to unit. Will continue to monitor.  Avelina LaineKimberly Louella Medaglia RN

## 2017-03-17 NOTE — Care Management (Addendum)
Late Entry:  03/17/2017 Call placed to pt at home to remind again of Outpatient Rehab appointment at [facility] for Tuesday, March 22, 2017 @ 1:30pm . No answer, message left on answering machine with date, time and CM phone number if pt has followup questions.  Johny Shockheryl Josimar Corning RN MPH, case Production designer, theatre/television/filmmanager.

## 2017-03-19 LAB — FUNGUS CULTURE, BLOOD
Culture: NO GROWTH
Special Requests: ADEQUATE

## 2017-03-22 ENCOUNTER — Ambulatory Visit (HOSPITAL_COMMUNITY): Payer: Medicare Other | Admitting: Physical Therapy

## 2017-03-22 ENCOUNTER — Encounter (HOSPITAL_COMMUNITY): Payer: Self-pay | Admitting: Physical Therapy

## 2017-03-22 ENCOUNTER — Telehealth (HOSPITAL_COMMUNITY): Payer: Self-pay | Admitting: Internal Medicine

## 2017-03-22 NOTE — Telephone Encounter (Signed)
03/22/17 The paperwork faxed by Case Manager from [facility] was not an actual referral for therapy.  [facility] and [facility] that we needed a new order.  I called [name]'s office, that is the patient's internal medical dr., and was transferred 3 different times and finally hung up to talk to father.  He said that he didn't think Pamela Goodwin needed therapy but she has an appt with [name] on 7/19 and he was going to talk to the dr and if he thinks she needs theraphy then he will ask for an order and call us back to reschedule.

## 2017-03-22 NOTE — Therapy (Signed)
[facility] [address] [place], [place], [address] Phone: [phone]   Fax:  [phone]  Patient Details  Name: Pamela LoraJewel Nephew MRN: [id number] Date of Birth: December 06, 1970 Referring Provider:  No ref. provider found  Encounter Date: 03/22/2017  Patient arrived for evaluation however upon inspection of packet/chart in EPIC by DPT, front desk staff, and rehab supervisor, she does not appear to have formal PT order. Attempted to reach PCP for order however unable to contact appropriate staff today. Will plan to reschedule when patient has formal PT order from MD. Rehab supervisor aware and advised during this situation.   Nedra HaiKristen Swetha Rayle PT, DPT [phone]  [facility] [address] [place], [place], [address] Phone: [phone]   Fax:  [phone]

## 2017-03-25 ENCOUNTER — Encounter (HOSPITAL_COMMUNITY): Payer: Self-pay | Admitting: Emergency Medicine

## 2017-03-25 DIAGNOSIS — E1122 Type 2 diabetes mellitus with diabetic chronic kidney disease: Secondary | ICD-10-CM | POA: Diagnosis present

## 2017-03-25 DIAGNOSIS — E1143 Type 2 diabetes mellitus with diabetic autonomic (poly)neuropathy: Secondary | ICD-10-CM | POA: Diagnosis present

## 2017-03-25 DIAGNOSIS — I160 Hypertensive urgency: Secondary | ICD-10-CM | POA: Diagnosis present

## 2017-03-25 DIAGNOSIS — I129 Hypertensive chronic kidney disease with stage 1 through stage 4 chronic kidney disease, or unspecified chronic kidney disease: Secondary | ICD-10-CM | POA: Diagnosis present

## 2017-03-25 DIAGNOSIS — G43A1 Cyclical vomiting, intractable: Secondary | ICD-10-CM | POA: Diagnosis present

## 2017-03-25 DIAGNOSIS — E669 Obesity, unspecified: Secondary | ICD-10-CM | POA: Diagnosis present

## 2017-03-25 DIAGNOSIS — Z7984 Long term (current) use of oral hypoglycemic drugs: Secondary | ICD-10-CM

## 2017-03-25 DIAGNOSIS — K3184 Gastroparesis: Secondary | ICD-10-CM | POA: Diagnosis present

## 2017-03-25 DIAGNOSIS — Z79899 Other long term (current) drug therapy: Secondary | ICD-10-CM

## 2017-03-25 DIAGNOSIS — N184 Chronic kidney disease, stage 4 (severe): Secondary | ICD-10-CM | POA: Diagnosis present

## 2017-03-25 DIAGNOSIS — E860 Dehydration: Secondary | ICD-10-CM | POA: Diagnosis present

## 2017-03-25 DIAGNOSIS — N179 Acute kidney failure, unspecified: Principal | ICD-10-CM | POA: Diagnosis present

## 2017-03-25 DIAGNOSIS — Z91013 Allergy to seafood: Secondary | ICD-10-CM

## 2017-03-25 DIAGNOSIS — J9811 Atelectasis: Secondary | ICD-10-CM | POA: Diagnosis present

## 2017-03-25 DIAGNOSIS — I2510 Atherosclerotic heart disease of native coronary artery without angina pectoris: Secondary | ICD-10-CM | POA: Diagnosis present

## 2017-03-25 DIAGNOSIS — Z6830 Body mass index (BMI) 30.0-30.9, adult: Secondary | ICD-10-CM

## 2017-03-25 DIAGNOSIS — Z888 Allergy status to other drugs, medicaments and biological substances status: Secondary | ICD-10-CM

## 2017-03-25 DIAGNOSIS — K5900 Constipation, unspecified: Secondary | ICD-10-CM | POA: Diagnosis present

## 2017-03-25 DIAGNOSIS — K219 Gastro-esophageal reflux disease without esophagitis: Secondary | ICD-10-CM | POA: Diagnosis present

## 2017-03-25 LAB — CBC
HEMATOCRIT: 33.7 % — AB (ref 36.0–46.0)
Hemoglobin: 11.2 g/dL — ABNORMAL LOW (ref 12.0–15.0)
MCH: 29.2 pg (ref 26.0–34.0)
MCHC: 33.2 g/dL (ref 30.0–36.0)
MCV: 87.8 fL (ref 78.0–100.0)
Platelets: 564 10*3/uL — ABNORMAL HIGH (ref 150–400)
RBC: 3.84 MIL/uL — AB (ref 3.87–5.11)
RDW: 15.2 % (ref 11.5–15.5)
WBC: 11.9 10*3/uL — AB (ref 4.0–10.5)

## 2017-03-25 LAB — CBG MONITORING, ED: Glucose-Capillary: 239 mg/dL — ABNORMAL HIGH (ref 65–99)

## 2017-03-25 NOTE — ED Triage Notes (Signed)
Patient reports generalized abdominal pain with nausea and emesis for several days , occasional diarrhea , denies fever or chills.

## 2017-03-26 ENCOUNTER — Inpatient Hospital Stay (HOSPITAL_COMMUNITY)
Admission: EM | Admit: 2017-03-26 | Discharge: 2017-03-31 | DRG: 683 | Disposition: A | Discharge status: Home Health | Payer: Medicare Other | Attending: Internal Medicine | Admitting: Internal Medicine

## 2017-03-26 ENCOUNTER — Encounter (HOSPITAL_COMMUNITY): Payer: Self-pay | Admitting: *Deleted

## 2017-03-26 ENCOUNTER — Inpatient Hospital Stay (HOSPITAL_COMMUNITY): Payer: Medicare Other

## 2017-03-26 ENCOUNTER — Emergency Department (HOSPITAL_COMMUNITY): Payer: Medicare Other

## 2017-03-26 DIAGNOSIS — K219 Gastro-esophageal reflux disease without esophagitis: Secondary | ICD-10-CM | POA: Diagnosis present

## 2017-03-26 DIAGNOSIS — K3184 Gastroparesis: Secondary | ICD-10-CM | POA: Diagnosis present

## 2017-03-26 DIAGNOSIS — I129 Hypertensive chronic kidney disease with stage 1 through stage 4 chronic kidney disease, or unspecified chronic kidney disease: Secondary | ICD-10-CM | POA: Diagnosis present

## 2017-03-26 DIAGNOSIS — N189 Chronic kidney disease, unspecified: Secondary | ICD-10-CM | POA: Diagnosis not present

## 2017-03-26 DIAGNOSIS — E669 Obesity, unspecified: Secondary | ICD-10-CM | POA: Diagnosis present

## 2017-03-26 DIAGNOSIS — I160 Hypertensive urgency: Secondary | ICD-10-CM | POA: Diagnosis present

## 2017-03-26 DIAGNOSIS — N184 Chronic kidney disease, stage 4 (severe): Secondary | ICD-10-CM | POA: Diagnosis present

## 2017-03-26 DIAGNOSIS — E860 Dehydration: Secondary | ICD-10-CM | POA: Diagnosis present

## 2017-03-26 DIAGNOSIS — Z79899 Other long term (current) drug therapy: Secondary | ICD-10-CM | POA: Diagnosis not present

## 2017-03-26 DIAGNOSIS — R109 Unspecified abdominal pain: Secondary | ICD-10-CM

## 2017-03-26 DIAGNOSIS — R112 Nausea with vomiting, unspecified: Secondary | ICD-10-CM | POA: Diagnosis present

## 2017-03-26 DIAGNOSIS — D72829 Elevated white blood cell count, unspecified: Secondary | ICD-10-CM | POA: Diagnosis not present

## 2017-03-26 DIAGNOSIS — E1122 Type 2 diabetes mellitus with diabetic chronic kidney disease: Secondary | ICD-10-CM | POA: Diagnosis present

## 2017-03-26 DIAGNOSIS — I2510 Atherosclerotic heart disease of native coronary artery without angina pectoris: Secondary | ICD-10-CM | POA: Diagnosis present

## 2017-03-26 DIAGNOSIS — J9811 Atelectasis: Secondary | ICD-10-CM | POA: Diagnosis present

## 2017-03-26 DIAGNOSIS — N179 Acute kidney failure, unspecified: Secondary | ICD-10-CM | POA: Diagnosis present

## 2017-03-26 DIAGNOSIS — Z91013 Allergy to seafood: Secondary | ICD-10-CM | POA: Diagnosis not present

## 2017-03-26 DIAGNOSIS — Z7984 Long term (current) use of oral hypoglycemic drugs: Secondary | ICD-10-CM | POA: Diagnosis not present

## 2017-03-26 DIAGNOSIS — R1115 Cyclical vomiting syndrome unrelated to migraine: Secondary | ICD-10-CM

## 2017-03-26 DIAGNOSIS — Z888 Allergy status to other drugs, medicaments and biological substances status: Secondary | ICD-10-CM | POA: Diagnosis not present

## 2017-03-26 DIAGNOSIS — Z6830 Body mass index (BMI) 30.0-30.9, adult: Secondary | ICD-10-CM | POA: Diagnosis not present

## 2017-03-26 DIAGNOSIS — E1143 Type 2 diabetes mellitus with diabetic autonomic (poly)neuropathy: Secondary | ICD-10-CM | POA: Diagnosis present

## 2017-03-26 DIAGNOSIS — G43A1 Cyclical vomiting, intractable: Secondary | ICD-10-CM | POA: Diagnosis present

## 2017-03-26 DIAGNOSIS — K5900 Constipation, unspecified: Secondary | ICD-10-CM | POA: Diagnosis present

## 2017-03-26 LAB — URINALYSIS, MICROSCOPIC (REFLEX): WBC, UA: NONE SEEN WBC/hpf (ref 0–5)

## 2017-03-26 LAB — URINALYSIS, ROUTINE W REFLEX MICROSCOPIC
Bilirubin Urine: NEGATIVE
GLUCOSE, UA: NEGATIVE mg/dL
Ketones, ur: 15 mg/dL — AB
LEUKOCYTES UA: NEGATIVE
Nitrite: NEGATIVE
SPECIFIC GRAVITY, URINE: 1.02 (ref 1.005–1.030)
pH: 6 (ref 5.0–8.0)

## 2017-03-26 LAB — GLUCOSE, CAPILLARY
GLUCOSE-CAPILLARY: 129 mg/dL — AB (ref 65–99)
GLUCOSE-CAPILLARY: 136 mg/dL — AB (ref 65–99)
GLUCOSE-CAPILLARY: 119 mg/dL — AB (ref 65–99)
Glucose-Capillary: 106 mg/dL — ABNORMAL HIGH (ref 65–99)
Glucose-Capillary: 100 mg/dL — ABNORMAL HIGH (ref 65–99)

## 2017-03-26 LAB — I-STAT BETA HCG BLOOD, ED (MC, WL, AP ONLY): I-stat hCG, quantitative: <5 m[IU]/mL (ref <5)

## 2017-03-26 LAB — BASIC METABOLIC PANEL
Anion gap: 12 (ref 5–15)
BUN: 37 mg/dL — ABNORMAL HIGH (ref 6–20)
CALCIUM: 10 mg/dL (ref 8.9–10.3)
CO2: 23 mmol/L (ref 22–32)
CREATININE: 3.61 mg/dL — AB (ref 0.44–1.00)
Chloride: 109 mmol/L (ref 101–111)
GFR, EST AFRICAN AMERICAN: 16 mL/min — AB (ref >60)
GFR, EST NON AFRICAN AMERICAN: 14 mL/min — AB (ref >60)
Glucose, Bld: 141 mg/dL — ABNORMAL HIGH (ref 65–99)
Potassium: 3.9 mmol/L (ref 3.5–5.1)
SODIUM: 144 mmol/L (ref 135–145)

## 2017-03-26 LAB — CREATININE, URINE, RANDOM: Creatinine, Urine: 141.76 mg/dL

## 2017-03-26 LAB — COMPREHENSIVE METABOLIC PANEL
ALBUMIN: 3.6 g/dL (ref 3.5–5.0)
ALT: 19 U/L (ref 14–54)
ANION GAP: 16 — AB (ref 5–15)
AST: 18 U/L (ref 15–41)
Alkaline Phosphatase: 37 U/L — ABNORMAL LOW (ref 38–126)
BUN: 42 mg/dL — AB (ref 6–20)
CHLORIDE: 100 mmol/L — AB (ref 101–111)
CO2: 24 mmol/L (ref 22–32)
Calcium: 11.7 mg/dL — ABNORMAL HIGH (ref 8.9–10.3)
Creatinine, Ser: 4.18 mg/dL — ABNORMAL HIGH (ref 0.44–1.00)
GFR calc Af Amer: 14 mL/min — ABNORMAL LOW (ref >60)
GFR calc non Af Amer: 12 mL/min — ABNORMAL LOW (ref >60)
GLUCOSE: 262 mg/dL — AB (ref 65–99)
POTASSIUM: 3.9 mmol/L (ref 3.5–5.1)
SODIUM: 140 mmol/L (ref 135–145)
Total Bilirubin: 0.8 mg/dL (ref 0.3–1.2)
Total Protein: 7.7 g/dL (ref 6.5–8.1)

## 2017-03-26 LAB — LACTIC ACID, PLASMA: Lactic Acid, Venous: 1.3 mmol/L (ref 0.5–1.9)

## 2017-03-26 LAB — SODIUM, URINE, RANDOM: Sodium, Ur: 34 mmol/L

## 2017-03-26 LAB — LIPASE, BLOOD: LIPASE: 43 U/L (ref 11–51)

## 2017-03-26 MED ORDER — OLOPATADINE HCL 0.1 % OP SOLN
1.0000 [drp] | Freq: Every day | OPHTHALMIC | Status: DC | PRN
  Filled 2017-03-26: qty 5

## 2017-03-26 MED ORDER — ENOXAPARIN SODIUM 30 MG/0.3ML ~~LOC~~ SOLN
30.0000 mg | SUBCUTANEOUS | Status: DC
  Administered 2017-03-26 – 2017-03-31 (×6): 30 mg via SUBCUTANEOUS
  Filled 2017-03-26 (×6): qty 0.3

## 2017-03-26 MED ORDER — METOCLOPRAMIDE HCL 5 MG/ML IJ SOLN
5.0000 mg | Freq: Three times a day (TID) | INTRAMUSCULAR | Status: DC
  Administered 2017-03-26 (×2): 5 mg via INTRAVENOUS
  Filled 2017-03-26 (×2): qty 2

## 2017-03-26 MED ORDER — LEVALBUTEROL HCL 0.63 MG/3ML IN NEBU: 0.6300 mg | INHALATION_SOLUTION | Freq: Four times a day (QID) | RESPIRATORY_TRACT | Status: DC | PRN

## 2017-03-26 MED ORDER — ACETAMINOPHEN 325 MG PO TABS: 650.0000 mg | ORAL_TABLET | Freq: Four times a day (QID) | ORAL | Status: DC | PRN

## 2017-03-26 MED ORDER — LABETALOL HCL 5 MG/ML IV SOLN
10.0000 mg | INTRAVENOUS | Status: DC | PRN
  Administered 2017-03-26: 10 mg via INTRAVENOUS
  Filled 2017-03-26: qty 4

## 2017-03-26 MED ORDER — AMLODIPINE BESYLATE 10 MG PO TABS
10.0000 mg | ORAL_TABLET | Freq: Every day | ORAL | Status: DC
  Administered 2017-03-26 – 2017-03-31 (×6): 10 mg via ORAL
  Filled 2017-03-26 (×6): qty 1

## 2017-03-26 MED ORDER — INSULIN ASPART 100 UNIT/ML ~~LOC~~ SOLN
7.0000 [IU] | SUBCUTANEOUS | Status: AC
Start: 2017-03-26 — End: 2017-03-27

## 2017-03-26 MED ORDER — ONDANSETRON HCL 4 MG/2ML IJ SOLN
4.0000 mg | Freq: Four times a day (QID) | INTRAMUSCULAR | Status: DC | PRN
  Administered 2017-03-27 – 2017-03-29 (×3): 4 mg via INTRAVENOUS
  Filled 2017-03-26 (×3): qty 2

## 2017-03-26 MED ORDER — ONDANSETRON HCL 4 MG PO TABS: 4.0000 mg | ORAL_TABLET | Freq: Four times a day (QID) | ORAL | Status: DC | PRN

## 2017-03-26 MED ORDER — INSULIN ASPART 100 UNIT/ML ~~LOC~~ SOLN: 0.0000 [IU] | Freq: Every day | SUBCUTANEOUS | Status: DC

## 2017-03-26 MED ORDER — FLUTICASONE PROPIONATE 50 MCG/ACT NA SUSP
1.0000 | Freq: Every day | NASAL | Status: DC | PRN
  Filled 2017-03-26: qty 16

## 2017-03-26 MED ORDER — SODIUM CHLORIDE 0.9 % IV BOLUS (SEPSIS)
1000.0000 mL | Freq: Once | INTRAVENOUS | Status: AC
  Administered 2017-03-26: 1000 mL via INTRAVENOUS

## 2017-03-26 MED ORDER — ACETAMINOPHEN 650 MG RE SUPP: 650.0000 mg | Freq: Four times a day (QID) | RECTAL | Status: DC | PRN

## 2017-03-26 MED ORDER — ONDANSETRON HCL 4 MG/2ML IJ SOLN
4.0000 mg | Freq: Once | INTRAMUSCULAR | Status: AC
  Administered 2017-03-26: 4 mg via INTRAVENOUS
  Filled 2017-03-26: qty 2

## 2017-03-26 MED ORDER — SODIUM CHLORIDE 0.9 % IV SOLN
INTRAVENOUS | Status: DC
  Administered 2017-03-26 – 2017-03-31 (×6): via INTRAVENOUS

## 2017-03-26 MED ORDER — METOCLOPRAMIDE HCL 5 MG/ML IJ SOLN
10.0000 mg | INTRAMUSCULAR | Status: AC
  Administered 2017-03-26: 10 mg via INTRAVENOUS
  Filled 2017-03-26: qty 2

## 2017-03-26 MED ORDER — METOPROLOL TARTRATE 25 MG PO TABS
25.0000 mg | ORAL_TABLET | Freq: Two times a day (BID) | ORAL | Status: DC
  Administered 2017-03-26 – 2017-03-31 (×11): 25 mg via ORAL
  Filled 2017-03-26 (×12): qty 1

## 2017-03-26 MED ORDER — PANTOPRAZOLE SODIUM 40 MG IV SOLR
40.0000 mg | Freq: Two times a day (BID) | INTRAVENOUS | Status: DC
  Administered 2017-03-26 – 2017-03-30 (×9): 40 mg via INTRAVENOUS
  Filled 2017-03-26 (×12): qty 40

## 2017-03-26 MED ORDER — INSULIN ASPART 100 UNIT/ML ~~LOC~~ SOLN
0.0000 [IU] | Freq: Three times a day (TID) | SUBCUTANEOUS | Status: DC
  Administered 2017-03-28 – 2017-03-30 (×2): 2 [IU] via SUBCUTANEOUS

## 2017-03-26 NOTE — ED Notes (Signed)
Pt reports nausea and vomiting for the past several days. Pt and family reports this has not resolved since she was last in the ED a week ago.

## 2017-03-26 NOTE — ED Provider Notes (Signed)
[facility]-EM[facility]GENCY DEPT Provider Note   CSN: [id number] Arrival date & time: 03/25/17  2309  By signing my name below, I, Pamela Goodwin, attest that this documentation has been prepared under the direction and in the presence of Zadie RhineWickline, Aydon Swamy, MD. Electronically Signed: Diona BrownerJennifer Goodwin, ED Scribe. 03/26/17. 1:11 AM.  LEVEL V CAVEAT: HPI and ROS limited due to uncooperative.   History   Chief Complaint Chief Complaint  Patient presents with  . Abdominal Pain  . Emesis    HPI Pamela Goodwin is a 46 y.o. female with a PMHx of acute renal failure, who presents to the Emergency Department complaining of worsening, generalized abdominal pain for the last several days. Associated sx include nausea, vomiting, and sore throat. Pt can't keep anything down. Pt was admitted to the ED on 03/11/17 and discharged on 03/15/17 with similar sx. Prior to that [place] was [place] in [place]. Pt denies fever, chills, diarrhea, back pain, and syncope.  The history is provided by the patient and a relative. The history is limited by the condition of the patient. No language interpreter was used.    Past Medical History:  Diagnosis Date  . Diabetes mellitus without complication (HCC)   . Hypertension     Patient Active Problem List   Diagnosis Date Noted  . Acute renal failure (ARF) (HCC) 03/11/2017    History reviewed. No pertinent surgical history.  OB History    No data available       Home Medications    Prior to Admission medications   Medication Sig Start Date End Date Taking? Authorizing Provider  amLODipine (NORVASC) 10 MG tablet Take 10 mg by mouth daily.    [provider]  Cholecalciferol (VITAMIN D3) 50000 units CAPS Take 50,000 Units by mouth every 7 (seven) days.    [provider]  Dextromethorphan-Guaifenesin (ROBITUSSIN SUGAR FREE) 10-100 MG/5ML liquid Take 5 mLs by mouth every 12 (twelve) hours as needed (for cough/congestion).    [provider]  fluticasone (FLONASE) 50 MCG/ACT nasal spray Place 1 spray into both nostrils daily as needed for allergies or rhinitis.    [provider]  glimepiride (AMARYL) 4 MG tablet Take 1 tablet (4 mg total) by mouth every morning. 03/15/17 03/15/18  Dorothea OgleMyers, Iskra M, MD  HYDROcodone-acetaminophen (NORCO/VICODIN) 5-325 MG tablet Take 1-2 tablets by mouth every 6 (six) hours as needed for moderate pain or severe pain. 03/15/17   Dorothea OgleMyers, Iskra M, MD  levalbuterol Pauline Aus(XOPENEX) 0.63 MG/3ML nebulizer solution Take 3 mLs (0.63 mg total) by nebulization every 6 (six) hours as needed for wheezing or shortness of breath. 03/15/17   Dorothea OgleMyers, Iskra M, MD  methocarbamol (ROBAXIN) 500 MG tablet Take 500 mg by mouth every 8 (eight) hours as needed for muscle spasms.    [provider]  metoprolol tartrate (LOPRESSOR) 25 MG tablet Take 25 mg by mouth 2 (two) times daily.    [provider]  Olopatadine HCl (PAZEO) 0.7 % SOLN Place 1 drop into both eyes daily as needed (for irritation).    [provider]  predniSONE (DELTASONE) 10 MG tablet Take 40 mg tablet 7/11 and taper down by 10 mg daily until completed 03/15/17   Dorothea OgleMyers, Iskra M, MD    Family History Family History  Problem Relation Age of Onset  . Other Mother        Glioblastoma, deceased    Social History Social History  Substance Use Topics  . Smoking status: Never Smoker  .  Smokeless tobacco: Never Used  . Alcohol use No     Allergies   Shellfish allergy and Hydralazine   Review of Systems Review of Systems  Unable to perform ROS: Other (uncooperative)  uncooperative   Physical Exam Updated Vital Signs BP (!) 182/119 (BP Location: Right Arm)   Pulse (!) 112   Resp 18   SpO2 96%   Physical Exam  Nursing note and vitals reviewed.   CONSTITUTIONAL: chronically ill appearing. HEAD: Normocephalic/atraumatic EYES: EOMI/PERRL ENMT: Mucous membranes moist NECK: supple no meningeal  signs SPINE/BACK:entire spine nontender CV: S1/S2 noted, no murmurs/rubs/gallops noted LUNGS: crackles in the bases ABDOMEN: soft, mild epigastric tenderness, no rebound or guarding, bowel sounds noted throughout abdomen GU:no cva tenderness NEURO: Pt is resting with eyes closed, prefers not to speak during exam, moves all extremitiesx4.  No facial droop.  EXTREMITIES: pulses normal/equal, full ROM SKIN: warm, color normal PSYCH: unable to assess.   ED Treatments / Results  DIAGNOSTIC STUDIES: Oxygen Saturation is 98% on RA, normal by my interpretation.   Labs (all labs ordered are listed, but only abnormal results are displayed) Labs Reviewed  COMPREHENSIVE METABOLIC PANEL - Abnormal; Notable for the following:       Result Value   Chloride 100 (*)    Glucose, Bld 262 (*)    BUN 42 (*)    Creatinine, Ser 4.18 (*)    Calcium 11.7 (*)    Alkaline Phosphatase 37 (*)    GFR calc non Af Amer 12 (*)    GFR calc Af Amer 14 (*)    Anion gap 16 (*)    All other components within normal limits  CBC - Abnormal; Notable for the following:    WBC 11.9 (*)    RBC 3.84 (*)    Hemoglobin 11.2 (*)    HCT 33.7 (*)    Platelets 564 (*)    All other components within normal limits  CBG MONITORING, ED - Abnormal; Notable for the following:    Glucose-Capillary 239 (*)    All other components within normal limits  LIPASE, BLOOD  URINALYSIS, ROUTINE W REFLEX MICROSCOPIC  I-STAT BETA HCG BLOOD, ED ([facility], [facility], [facility] ONLY)    EKG  EKG Interpretation None       Radiology Dg Chest Port 1 View  Result Date: 03/26/2017 CLINICAL DATA:  Dyspnea and vomiting x1 day EXAM: PORTABLE CHEST 1 VIEW COMPARISON:  Chest CT 03/11/2017, CXR 03/11/2017 FINDINGS: The heart size and mediastinal contours are within normal limits. Low lung volumes are present. Left basilar atelectasis noted. The visualized skeletal structures are unremarkable. IMPRESSION: No active disease. Left lower lobe atelectasis with low  lung volumes. Electronically Signed   By: Tollie Eth M.D.   On: 03/26/2017 02:08    Procedures Procedures   Medications Ordered in ED Medications  ondansetron (ZOFRAN) injection 4 mg (4 mg Intravenous Given 03/26/17 0136)  sodium chloride 0.9 % bolus 1,000 mL (1,000 mLs Intravenous New Bag/Given 03/26/17 0137)     Initial Impression / Assessment and Plan / ED Course  I have reviewed the triage vital signs and the nursing notes.  Pertinent labs & imaging results that were available during my care of the patient were reviewed by me and considered in my medical decision making (see chart for details).     Pt presents with continued nausea/vomiting Recently admitted for AKI, and after discharge family reports she was never able to take her PO meds Pt is uncooperative and prefers to  have family speak for her She has mild epigastric tenderness, though this is not new She may have gastroparesis given h/o DM And intractable vomiting Unclear cause of renal failure as had extensive workup and it was felt that it could have been chronic Dad states that prior to June, she never had any issues and no hospitalizations, so unclear why this became acute  D/w dr Madelyn Flavors for admission   Final Clinical Impressions(s) / ED Diagnoses   Final diagnoses:  Intractable cyclical vomiting with nausea  AKI (acute kidney injury) (HCC)  Dehydration    New Prescriptions New Prescriptions   No medications on file   I personally performed the services described in this documentation, which was scribed in my presence. The recorded information has been reviewed and is accurate.       Zadie Rhine, MD 03/26/17 [id number]

## 2017-03-26 NOTE — H&P (Addendum)
History and Physical    Pamela Goodwin WUJ:[id number] DOB: 1970-11-07 DOA: 03/26/2017  Referring MD/NP/PA: Dr. Bebe Shaggy PCP: [name], MD  Patient coming from: home  Chief Complaint: Nausea and vomiting  HPI: Pamela Goodwin is a 46 y.o. female with medical history significant of HTN, DM type II, and pancreatitis; who presents with complaints of nausea and vomiting. History is obtained from the family members as the patient does not really give information. The patient just recently had been admitted into the hospital on 7/6-7/10 diagnosed with acute renal failure. Family notes that the patient had no previous history of kidney problems prior to that and that she follows with her primary care doctor routinely. Before that the patient had been admitted into hospital in [facility] for pancreatitis, but details are unclear surrounding this.  Since being home family notes that the patient has had a significantly poor appetite and in the last 3-4 days has had persistent nausea and vomiting unable to keep any food or liquids or medications down. She is complaining of associated symptoms of epigastric like abdominal pain for which they thought she had a recurrence of pancreatitis. She follows with her primary care doctor and he had scheduled her an appointment with the nephrologist on 7/23. Other associated symptoms include patient complaining of feeling hot and cold and urinary urgency with inability to void.  ED Course: On admission into the emergency department patient was seen to be afebrile, pulse 110-114, respirations 12-31, blood pressure elevated up to 202/112, and O2 saturation maintained on room air.  Review of Systems: Review of Systems  Unable to perform ROS: Acuity of condition  Constitutional: Positive for malaise/fatigue.       Positive Poor appetite  HENT: Negative for ear discharge and nosebleeds.   Eyes: Negative for pain and discharge.  Respiratory: Negative for hemoptysis.     Cardiovascular: Negative for chest pain and leg swelling.  Gastrointestinal: Positive for abdominal pain, nausea and vomiting. Negative for constipation.  Genitourinary: Positive for urgency.  Musculoskeletal: Negative for falls and joint pain.  Skin: Negative for rash.  Neurological: Negative for seizures and loss of consciousness.  Endo/Heme/Allergies: Does not bruise/bleed easily.  Psychiatric/Behavioral: Negative for hallucinations and substance abuse.    Past Medical History:  Diagnosis Date  . Diabetes mellitus without complication (HCC)   . Hypertension     History reviewed. No pertinent surgical history.   reports that she has never smoked. She has never used smokeless tobacco. She reports that she does not drink alcohol or use drugs.  Allergies  Allergen Reactions  . Shellfish Allergy Anaphylaxis    Angioedema also  . Hydralazine Other (See Comments)    Patient "zoned out" and caused extreme lethargy (??)    Family History  Problem Relation Age of Onset  . Other Mother        Glioblastoma, deceased    Prior to Admission medications   Medication Sig Start Date End Date Taking? Authorizing Provider  amLODipine (NORVASC) 10 MG tablet Take 10 mg by mouth daily.    [provider]  Cholecalciferol (VITAMIN D3) 50000 units CAPS Take 50,000 Units by mouth every 7 (seven) days.    [provider]  Dextromethorphan-Guaifenesin (ROBITUSSIN SUGAR FREE) 10-100 MG/5ML liquid Take 5 mLs by mouth every 12 (twelve) hours as needed (for cough/congestion).    [provider]  fluticasone (FLONASE) 50 MCG/ACT nasal spray Place 1 spray into both nostrils daily as needed for allergies or rhinitis.    [provider]  glimepiride (AMARYL) 4 MG tablet Take 1 tablet (4 mg total) by mouth every morning. 03/15/17 03/15/18  Dorothea Ogle, MD  HYDROcodone-acetaminophen (NORCO/VICODIN) 5-325 MG tablet Take 1-2 tablets by mouth every 6 (six) hours as needed  for moderate pain or severe pain. 03/15/17   Dorothea Ogle, MD  levalbuterol Pauline Aus) 0.63 MG/3ML nebulizer solution Take 3 mLs (0.63 mg total) by nebulization every 6 (six) hours as needed for wheezing or shortness of breath. 03/15/17   Dorothea Ogle, MD  methocarbamol (ROBAXIN) 500 MG tablet Take 500 mg by mouth every 8 (eight) hours as needed for muscle spasms.    [provider]  metoprolol tartrate (LOPRESSOR) 25 MG tablet Take 25 mg by mouth 2 (two) times daily.    [provider]  Olopatadine HCl (PAZEO) 0.7 % SOLN Place 1 drop into both eyes daily as needed (for irritation).    [provider]  predniSONE (DELTASONE) 10 MG tablet Take 40 mg tablet 7/11 and taper down by 10 mg daily until completed 03/15/17   Dorothea Ogle, MD    Physical Exam:  Constitutional: Obese female who appears to be in moderate distress actively retching Vitals:   03/26/17 0130 03/26/17 0145 03/26/17 0200 03/26/17 0230  BP: (!) 182/114 (!) 194/113 (!) 202/112 (!) 196/127  Pulse: (!) 112 (!) 110 (!) 110 (!) 114  Resp: 19 (!) 25 (!) 31 14  SpO2: 97% 93% 96% 96%   Eyes: PERRL, lids and conjunctivae normal ENMT: Mucous membranes are moist. Posterior pharynx clear of any exudate or lesions.   Neck: normal, supple, no masses, no thyromegaly Respiratory: Decreased aeration with bibasilar crackles appreciated. No accessory muscle use.  Cardiovascular: Tachycardic, no murmurs / rubs / gallops. No extremity edema. 2+ pedal pulses. No carotid bruits.  Abdomen: Generalized abdominal tenderness present with positive bowel sounds. Musculoskeletal: no clubbing / cyanosis. No joint deformity upper and lower extremities. Good ROM, no contractures. Normal muscle tone.  Skin: no rashes, lesions, ulcers. No induration Neurologic: CN 2-12 grossly intact. Sensation intact, DTR normal. Strength 5/5 in all 4.  Psychiatric: Normal judgment and insight. Alert and oriented x 3. Normal mood.     Labs on  Admission: I have personally reviewed following labs and imaging studies  CBC:  Recent Labs Lab 03/25/17 2329  WBC 11.9*  HGB 11.2*  HCT 33.7*  MCV 87.8  PLT 564*   Basic Metabolic Panel:  Recent Labs Lab 03/25/17 2329  NA 140  K 3.9  CL 100*  CO2 24  GLUCOSE 262*  BUN 42*  CREATININE 4.18*  CALCIUM 11.7*   GFR: Estimated Creatinine Clearance: 15.8 mL/min (A) (by C-G formula based on SCr of 4.18 mg/dL (H)). Liver Function Tests:  Recent Labs Lab 03/25/17 2329  AST 18  ALT 19  ALKPHOS 37*  BILITOT 0.8  PROT 7.7  ALBUMIN 3.6    Recent Labs Lab 03/25/17 2329  LIPASE 43   No results for input(s): AMMONIA in the last 168 hours. Coagulation Profile: No results for input(s): INR, PROTIME in the last 168 hours. Cardiac Enzymes: No results for input(s): CKTOTAL, CKMB, CKMBINDEX, TROPONINI in the last 168 hours. BNP (last 3 results) No results for input(s): PROBNP in the last 8760 hours. HbA1C: No results for input(s): HGBA1C in the last 72 hours. CBG:  Recent Labs Lab 03/25/17 2352  GLUCAP 239*   Lipid Profile: No results for input(s): CHOL, HDL, LDLCALC, TRIG, CHOLHDL, LDLDIRECT in the last 72 hours.  Thyroid Function Tests: No results for input(s): TSH, T4TOTAL, FREET4, T3FREE, THYROIDAB in the last 72 hours. Anemia Panel: No results for input(s): VITAMINB12, FOLATE, FERRITIN, TIBC, IRON, RETICCTPCT in the last 72 hours. Urine analysis:    Component Value Date/Time   COLORURINE YELLOW 03/11/2017 1915   APPEARANCEUR HAZY (A) 03/11/2017 1915   LABSPEC 1.011 03/11/2017 1915   PHURINE 5.0 03/11/2017 1915   GLUCOSEU NEGATIVE 03/11/2017 1915   HGBUR SMALL (A) 03/11/2017 1915   BILIRUBINUR NEGATIVE 03/11/2017 1915   KETONESUR NEGATIVE 03/11/2017 1915   PROTEINUR 100 (A) 03/11/2017 1915   NITRITE NEGATIVE 03/11/2017 1915   LEUKOCYTESUR NEGATIVE 03/11/2017 1915   Sepsis Labs: No results found for this or any previous visit (from the past 240  hour(s)).   Radiological Exams on Admission: Dg Chest Port 1 View  Result Date: 03/26/2017 CLINICAL DATA:  Dyspnea and vomiting x1 day EXAM: PORTABLE CHEST 1 VIEW COMPARISON:  Chest CT 03/11/2017, CXR 03/11/2017 FINDINGS: The heart size and mediastinal contours are within normal limits. Low lung volumes are present. Left basilar atelectasis noted. The visualized skeletal structures are unremarkable. IMPRESSION: No active disease. Left lower lobe atelectasis with low lung volumes. Electronically Signed   By: Tollie Eth M.D.   On: 03/26/2017 02:08    EKG: Independently reviewed. Sinus tachycardia at 113 bpm  Assessment/Plan Intractable nausea and vomiting: Acute. Patient with inability to keep any food or liquids down. On the differential includes hypercalcemia vs gastroparesis given history of diabetes - Admit to telemetry bed - Antiemetics prn N/V  - IV fluids normal saline at 100 ml/hr - Reglan IV x1 dose, reevaluate and see if this needs to be continued in a.m. - Patient may benefit from a gastric emptying study to rule out the possibility of gastroparesis as a cause of nausea vomiting symptoms. - advance diet as tolerated  Epigastric abdominal pain: Acute. Given patient's previous history of pancreatitis question possibility of pseudocyst or some obstructive abnormality. - Order placed to obtain records from [facility] hospitalization - Check stat CT scan of the abdomen/pelvis  Acute kidney injury on chronic renal failure IV: Patient creatinine had trended down to 3.6 after her last hospitalization, but she presents with a creatinine of 4.18 with BUN 42. Previous FeNa was>3. - Strict I&Os - recheck FeNa - IV fluids of NS as tolerated - Bladder scan every shift monitoring for urinary retention - May want to consult nephrology in the a.m.   Hypertensive urgency - Continue amlodipine and metoprolol when able -  Labetalol IV prn sBP >180  Leukocytosis: WBC elevated at 11.9. Per review  of records it appears that this is relatively unchanged from patient's last discharge note patient had been on steroids. - Check lactic acid level  Left lung atelectasis - Incentive spirometry  Hypercalcemia: Patient's initial calcium level elevated at 11.7. Question if this could be underlying cause for patient's presenting symptoms.  Diabetes mellitus type 2: Appears well controlled as last hemoglobin A1c was noted to be 6.4 on 7/6. Patient's initial presenting glucose is 262 she is been unable to keep any of her diabetic medications down due to nausea and vomiting symptoms. - Hypoglycemic protocol - Hold Amaryl - Give 7 units of NovoLog aspart now - CBGs every before meals and at bedtime with sensitive a sliding scale insulin.  Obesity   Gerd ppx - Protonix  DVT prophylaxis: heparin Code Status: Full Family Communication: Discussed wound care with the patient and family presented because of Disposition Plan: Discharge home  once medically stable  Consults called: none Admission status: Inpatient  Clydie Braunondell A Haelie Clapp MD [facility] Pager [phone]   If 7PM-7AM, please contact night-coverage [email or web address] Password [email or web address]  03/26/2017, 3:42 AM

## 2017-03-26 NOTE — Progress Notes (Signed)
PROGRESS NOTE    Pamela Goodwin  ZOX:[id number] DOB: [birth year] DOA: 03/26/2017 PCP: [name], MD    Brief Narrative:  46 y.o. female with medical history significant of HTN, DM type II, and pancreatitis; who presents with complaints of nausea and vomiting. History is obtained from the family members as the patient does not really give information. The patient just recently had been admitted into the hospital on 7/6-7/10 diagnosed with acute renal failure. Family notes that the patient had no previous history of kidney problems prior to that and that she follows with her primary care doctor routinely. Before that the patient had been admitted into hospital in [facility] for pancreatitis, but details are unclear surrounding this.  Since being home family notes that the patient has had a significantly poor appetite and in the last 3-4 days has had persistent nausea and vomiting unable to keep any food or liquids or medications down. She is complaining of associated symptoms of epigastric like abdominal pain for which they thought she had a recurrence of pancreatitis. She follows with her primary care doctor and he had scheduled her an appointment with the nephrologist on 7/23. Other associated symptoms include patient complaining of feeling hot and cold and urinary urgency with inability to void.  Assessment & Plan:   Principal Problem:   Intractable nausea and vomiting Active Problems:   Acute kidney injury superimposed on chronic kidney disease (HCC)   Abdominal pain   Hypertensive urgency   Leukocytosis   Hypercalcemia  Intractable nausea and vomiting: Acute.  - decreased BS on exam - Pt not tolerating meds this AM - Cont antiemetics prn N/V  - Cont with IV fluids normal saline at 100 ml/hr - As patient remains symptomatic, have ordered gastric emptying study - Will start patient on scheduled reglan  Epigastric abdominal pain: Acute.  - Check stat CT scan of the abdomen/pelvis neg for  acute process - Question gastroparesis as etiology of symptoms - GES per above  Acute kidney injury on chronic renal failure IV: Patient creatinine had trended down to 3.6 after her last hospitalization, but she presents with a creatinine of 4.18 with BUN 42. Previous FeNa was>3. - Patient continued on IVF hyration - Labs reviewed. Renal function improved - Repeat bmet in AM.   Hypertensive urgency - Continue amlodipine and metoprolol when able -  Labetalol IV ordered for sBP >180 - Stable at present  Leukocytosis: WBC elevated at 11.9 at time of admit. Per review of records it appears that this is relatively unchanged from patient's last discharge note patient had been on steroids. - lactate less than 2  Left lung atelectasis - Cont with incentive spirometry as tolerated  Hypercalcemia: Patient's initial calcium level elevated at 11.7.  - labs reviewed. Improved to 10.0  Diabetes mellitus type 2: Appears well controlled as last hemoglobin A1c was noted to be 6.4 on 7/6. Patient's initial presenting glucose is 262 she is been unable to keep any of her diabetic medications down due to nausea and vomiting symptoms. - Hypoglycemic protocol - Continue to hold Amaryl - Continue with CBGs every before meals and at bedtime with sensitive a sliding scale insulin.  Obesity - Stable at present   Gerd ppx - Protonix currently  DVT prophylaxis: Lovenox subQ Code Status: Full Family Communication: Pt in room, family at bedside Disposition Plan: Uncertain at this time  Consultants:     Procedures:     Antimicrobials: Anti-infectives    None  Subjective: Still feeling nauseated  Objective: Vitals:   03/26/17 0430 03/26/17 0437 03/26/17 0528 03/26/17 0833  BP: (!) 180/100  (!) 182/101 (!) 160/92  Pulse: (!) 117 (!) 113 (!) 105 99  Resp:  (!) 21 (!) 22 18  Temp:   97.7 F (36.5 C) 98.5 F (36.9 C)  TempSrc:   Oral Oral  SpO2: 96% 96% 100% 96%  Weight:    71.3 kg (157 lb 3.2 oz)   Height:   5' (1.524 m)     Intake/Output Summary (Last 24 hours) at 03/26/17 1536 Last data filed at 03/26/17 1526  Gross per 24 hour  Intake          1928.33 ml  Output                0 ml  Net          1928.33 ml   Filed Weights   03/26/17 0528  Weight: 71.3 kg (157 lb 3.2 oz)    Examination:  General exam: Appears calm and comfortable  Respiratory system: Clear to auscultation. Respiratory effort normal. Cardiovascular system: S1 & S2 heard, RRR. No JVD, murmurs, rubs, gallops or clicks. No pedal edema. Gastrointestinal system: generally tender, mildly distended Central nervous system: Alert and oriented. No focal neurological deficits. Extremities: Symmetric 5 x 5 power. Skin: No rashes, lesions Psychiatry: Judgement and insight appear normal. Mood & affect appropriate.   Data Reviewed: I have personally reviewed following labs and imaging studies  CBC:  Recent Labs Lab 03/25/17 2329  WBC 11.9*  HGB 11.2*  HCT 33.7*  MCV 87.8  PLT 564*   Basic Metabolic Panel:  Recent Labs Lab 03/25/17 2329 03/26/17 0803  NA 140 144  K 3.9 3.9  CL 100* 109  CO2 24 23  GLUCOSE 262* 141*  BUN 42* 37*  CREATININE 4.18* 3.61*  CALCIUM 11.7* 10.0   GFR: Estimated Creatinine Clearance: 17.3 mL/min (A) (by C-G formula based on SCr of 3.61 mg/dL (H)). Liver Function Tests:  Recent Labs Lab 03/25/17 2329  AST 18  ALT 19  ALKPHOS 37*  BILITOT 0.8  PROT 7.7  ALBUMIN 3.6    Recent Labs Lab 03/25/17 2329  LIPASE 43   No results for input(s): AMMONIA in the last 168 hours. Coagulation Profile: No results for input(s): INR, PROTIME in the last 168 hours. Cardiac Enzymes: No results for input(s): CKTOTAL, CKMB, CKMBINDEX, TROPONINI in the last 168 hours. BNP (last 3 results) No results for input(s): PROBNP in the last 8760 hours. HbA1C: No results for input(s): HGBA1C in the last 72 hours. CBG:  Recent Labs Lab 03/25/17 2352  03/26/17 0520 03/26/17 0734 03/26/17 1211  GLUCAP 239* 129* 136* 119*   Lipid Profile: No results for input(s): CHOL, HDL, LDLCALC, TRIG, CHOLHDL, LDLDIRECT in the last 72 hours. Thyroid Function Tests: No results for input(s): TSH, T4TOTAL, FREET4, T3FREE, THYROIDAB in the last 72 hours. Anemia Panel: No results for input(s): VITAMINB12, FOLATE, FERRITIN, TIBC, IRON, RETICCTPCT in the last 72 hours. Sepsis Labs:  Recent Labs Lab 03/26/17 0542  LATICACIDVEN 1.3    No results found for this or any previous visit (from the past 240 hour(s)).   Radiology Studies: Ct Abdomen Pelvis Wo Contrast  Result Date: 03/26/2017 CLINICAL DATA:  Intractable nausea and vomiting. EXAM: CT ABDOMEN AND PELVIS WITHOUT CONTRAST TECHNIQUE: Multidetector CT imaging of the abdomen and pelvis was performed following the standard protocol without IV contrast. COMPARISON:  CT 03/06/2017 FINDINGS: Lower chest: Mild  bibasilar atelectasis.  Trace pericardial fluid. Hepatobiliary: No focal hepatic lesion allowing for lack contrast. Gallbladder physiologically distended, no calcified stone. No biliary dilatation. Pancreas: No ductal dilatation or inflammation. Spleen: Normal in size without focal abnormality. Adrenals/Urinary Tract: No adrenal nodule. No hydronephrosis or perinephric edema. No urolithiasis. Urinary bladder is physiologically distended without wall thickening. Stomach/Bowel: Stomach physiologically distended. No bowel inflammation, wall thickening or obstruction. Normal appendix. Small stool burden. Vascular/Lymphatic: Aorto bi-iliac atherosclerosis, age advanced. Abdominal or pelvic adenopathy. Reproductive: Intrauterine device appropriately positioned in the uterus. Low-density lesion in the cervix likely nabothian cyst, unchanged. No adnexal mass. Other: No free air, free fluid, or intra-abdominal fluid collection. Small fat containing umbilical hernia. Musculoskeletal: Stable from prior exam. Mild  degenerative change in the spine. Sclerosis about the iliac aspect of both sacroiliac joints, left greater than right. IMPRESSION: 1. No acute abnormality or explanation for nausea and vomiting. 2. Aortic and branch atherosclerosis, advanced for age. Aortic Atherosclerosis (ICD10-I70.0). Electronically Signed   By: Rubye Oaks M.D.   On: 03/26/2017 05:36   Dg Chest Port 1 View  Result Date: 03/26/2017 CLINICAL DATA:  Dyspnea and vomiting x1 day EXAM: PORTABLE CHEST 1 VIEW COMPARISON:  Chest CT 03/11/2017, CXR 03/11/2017 FINDINGS: The heart size and mediastinal contours are within normal limits. Low lung volumes are present. Left basilar atelectasis noted. The visualized skeletal structures are unremarkable. IMPRESSION: No active disease. Left lower lobe atelectasis with low lung volumes. Electronically Signed   By: Tollie Eth M.D.   On: 03/26/2017 02:08    Scheduled Meds: . amLODipine  10 mg Oral Daily  . enoxaparin (LOVENOX) injection  30 mg Subcutaneous Q24H  . insulin aspart  0-5 Units Subcutaneous QHS  . insulin aspart  0-9 Units Subcutaneous TID WC  . insulin aspart  7 Units Subcutaneous STAT  . metoCLOPramide (REGLAN) injection  5 mg Intravenous Q8H  . metoprolol tartrate  25 mg Oral BID  . pantoprazole (PROTONIX) IV  40 mg Intravenous Q12H   Continuous Infusions: . sodium chloride 100 mL/hr at 03/26/17 1526     LOS: 0 days   Besan Ketchem, Scheryl Marten, MD [facility] Pager [phone]  If 7PM-7AM, please contact night-coverage [email or web address] Password [facility] 03/26/2017, 3:36 PM

## 2017-03-26 NOTE — Progress Notes (Signed)
New Admission Note: [facility]   Arrival Method: via stretcher Mental Orientation: alert and oriented Telemetry: initiated Assessment: Completed Skin: Intact IV: L hand Pain: denies Tubes: None Safety Measures: Safety Fall Prevention Plan has been discussed  Admission: to be completed [address] [place] Orientation: Patient has been orientated to the room, unit and staff.  Family: Two brothers and father at the bedside  Orders to be reviewed and implemented. Will continue to monitor the patient. Call light has been placed within reach and bed alarm has been activated.   Burley SaverKami Jolyn Deshmukh, BSN, RN-BC Phone: [phone]

## 2017-03-27 ENCOUNTER — Inpatient Hospital Stay (HOSPITAL_COMMUNITY): Payer: Medicare Other

## 2017-03-27 LAB — GLUCOSE, CAPILLARY
GLUCOSE-CAPILLARY: 90 mg/dL (ref 65–99)
GLUCOSE-CAPILLARY: 92 mg/dL (ref 65–99)
GLUCOSE-CAPILLARY: 95 mg/dL (ref 65–99)
Glucose-Capillary: 88 mg/dL (ref 65–99)

## 2017-03-27 LAB — BASIC METABOLIC PANEL
ANION GAP: 9 (ref 5–15)
BUN: 27 mg/dL — ABNORMAL HIGH (ref 6–20)
CALCIUM: 9.3 mg/dL (ref 8.9–10.3)
CO2: 23 mmol/L (ref 22–32)
Chloride: 114 mmol/L — ABNORMAL HIGH (ref 101–111)
Creatinine, Ser: 3.25 mg/dL — ABNORMAL HIGH (ref 0.44–1.00)
GFR, EST AFRICAN AMERICAN: 19 mL/min — AB (ref >60)
GFR, EST NON AFRICAN AMERICAN: 16 mL/min — AB (ref >60)
Glucose, Bld: 105 mg/dL — ABNORMAL HIGH (ref 65–99)
POTASSIUM: 3.9 mmol/L (ref 3.5–5.1)
SODIUM: 146 mmol/L — AB (ref 135–145)

## 2017-03-27 MED ORDER — METOCLOPRAMIDE HCL 5 MG/ML IJ SOLN
5.0000 mg | Freq: Four times a day (QID) | INTRAMUSCULAR | Status: DC
  Administered 2017-03-27 – 2017-03-29 (×10): 5 mg via INTRAVENOUS
  Filled 2017-03-27 (×9): qty 2

## 2017-03-27 NOTE — Progress Notes (Signed)
PROGRESS NOTE    Pamela Goodwin  ZOX:[id number] DOB: 01/02/1971 DOA: 03/26/2017 PCP: [name], MD    Brief Narrative:  46 y.o. female with medical history significant of HTN, DM type II, and pancreatitis; who presents with complaints of nausea and vomiting. History is obtained from the family members as the patient does not really give information. The patient just recently had been admitted into the hospital on 7/6-7/10 diagnosed with acute renal failure. Family notes that the patient had no previous history of kidney problems prior to that and that she follows with her primary care doctor routinely. Before that the patient had been admitted into hospital in [facility] for pancreatitis, but details are unclear surrounding this.  Since being home family notes that the patient has had a significantly poor appetite and in the last 3-4 days has had persistent nausea and vomiting unable to keep any food or liquids or medications down. She is complaining of associated symptoms of epigastric like abdominal pain for which they thought she had a recurrence of pancreatitis. She follows with her primary care doctor and he had scheduled her an appointment with the nephrologist on 7/23. Other associated symptoms include patient complaining of feeling hot and cold and urinary urgency with inability to void.  Assessment & Plan:   Principal Problem:   Intractable nausea and vomiting Active Problems:   Acute kidney injury superimposed on chronic kidney disease (HCC)   Abdominal pain   Hypertensive urgency   Leukocytosis   Hypercalcemia  Intractable nausea and vomiting: Acute.  - decreased BS on exam - Pt not tolerating meds this AM - Cont antiemetics prn N/V  - Cont with IV fluids normal saline at 100 ml/hr - Gastric emptying study ordered, patient was unable to tolerate study, rescheduled GES to 7/24 - Continues with intractable n/v and was unable to tolerate contrast for GES - Patient currently on TID  reglan, increase to 4 times daily  Epigastric abdominal pain: Acute.  - CT scan of the abdomen/pelvis neg for acute process - suspecting gastroparesis as etiology of symptoms - GES per above  Acute kidney injury on chronic renal failure IV: Patient creatinine had trended down to 3.6 after her last hospitalization, but she presents with a creatinine of 4.18 with BUN 42. Previous FeNa was>3. - Patient continued on IVF hyration - Labs reviewed. Renal function continues to improve - Cont IVF as tolerated   Hypertensive urgency - Continue amlodipine and metoprolol when able -  Labetalol IV ordered for sBP >180 - Stable at present  Leukocytosis: WBC elevated at 11.9 at time of admit. Per review of records it appears that this is relatively unchanged from patient's last discharge note patient had been on steroids. - lactate noted to be less than 2  Left lung atelectasis - Cont with incentive spirometry as tolerated - Appears stable at this time  Hypercalcemia: Patient's initial calcium level elevated at 11.7.  - normalized  Diabetes mellitus type 2: Appears well controlled as last hemoglobin A1c was noted to be 6.4 on 7/6. Patient's initial presenting glucose is 262 she is been unable to keep any of her diabetic medications down due to nausea and vomiting symptoms. - Hypoglycemic protocol - Amaryl currently on hold - Continue with CBGs every before meals and at bedtime with sensitive a sliding scale insulin.  Obesity - Remains stable at present   Gerd ppx - Currently on Protonix  DVT prophylaxis: Lovenox subQ Code Status: Full Family Communication: Pt in room, family at  bedside Disposition Plan: Uncertain at this time  Consultants:     Procedures:     Antimicrobials: Anti-infectives    None      Subjective: Continues to be nauseated with generalized abd pain  Objective: Vitals:   03/26/17 2026 03/26/17 2100 03/27/17 0500 03/27/17 0830  BP: (!) 180/103  (!) 154/91 (!) 173/99 (!) 173/88  Pulse: 93 87 98 (!) 103  Resp: 18 20 16 18   Temp:  98.7 F (37.1 C) 98.7 F (37.1 C) 98 F (36.7 C)  TempSrc: Oral Oral Oral Oral  SpO2: 96% 98% 100% 100%  Weight:      Height:        Intake/Output Summary (Last 24 hours) at 03/27/17 1509 Last data filed at 03/27/17 0600  Gross per 24 hour  Intake          1048.33 ml  Output             1050 ml  Net            -1.67 ml   Filed Weights   03/26/17 0528  Weight: 71.3 kg (157 lb 3.2 oz)    Examination: General exam: Awake, laying in bed, in nad Respiratory system: Normal respiratory effort, no wheezing Cardiovascular system: regular rate, s1, s2 Gastrointestinal system: Generally tender, decreased BS Central nervous system: CN2-12 grossly intact, strength intact Extremities: Perfused, no clubbing Skin: Normal skin turgor, no notable skin lesions seen Psychiatry: Mood normal // no visual hallucinations   Data Reviewed: I have personally reviewed following labs and imaging studies  CBC:  Recent Labs Lab 03/25/17 2329  WBC 11.9*  HGB 11.2*  HCT 33.7*  MCV 87.8  PLT 564*   Basic Metabolic Panel:  Recent Labs Lab 03/25/17 2329 03/26/17 0803 03/27/17 0257  NA 140 144 146*  K 3.9 3.9 3.9  CL 100* 109 114*  CO2 24 23 23   GLUCOSE 262* 141* 105*  BUN 42* 37* 27*  CREATININE 4.18* 3.61* 3.25*  CALCIUM 11.7* 10.0 9.3   GFR: Estimated Creatinine Clearance: 19.3 mL/min (A) (by C-G formula based on SCr of 3.25 mg/dL (H)). Liver Function Tests:  Recent Labs Lab 03/25/17 2329  AST 18  ALT 19  ALKPHOS 37*  BILITOT 0.8  PROT 7.7  ALBUMIN 3.6    Recent Labs Lab 03/25/17 2329  LIPASE 43   No results for input(s): AMMONIA in the last 168 hours. Coagulation Profile: No results for input(s): INR, PROTIME in the last 168 hours. Cardiac Enzymes: No results for input(s): CKTOTAL, CKMB, CKMBINDEX, TROPONINI in the last 168 hours. BNP (last 3 results) No results for input(s):  PROBNP in the last 8760 hours. HbA1C: No results for input(s): HGBA1C in the last 72 hours. CBG:  Recent Labs Lab 03/26/17 1211 03/26/17 1732 03/26/17 2031 03/27/17 0728 03/27/17 1212  GLUCAP 119* 106* 100* 90 88   Lipid Profile: No results for input(s): CHOL, HDL, LDLCALC, TRIG, CHOLHDL, LDLDIRECT in the last 72 hours. Thyroid Function Tests: No results for input(s): TSH, T4TOTAL, FREET4, T3FREE, THYROIDAB in the last 72 hours. Anemia Panel: No results for input(s): VITAMINB12, FOLATE, FERRITIN, TIBC, IRON, RETICCTPCT in the last 72 hours. Sepsis Labs:  Recent Labs Lab 03/26/17 0542  LATICACIDVEN 1.3    No results found for this or any previous visit (from the past 240 hour(s)).   Radiology Studies: Ct Abdomen Pelvis Wo Contrast  Result Date: 03/26/2017 CLINICAL DATA:  Intractable nausea and vomiting. EXAM: CT ABDOMEN AND PELVIS WITHOUT CONTRAST TECHNIQUE:  Multidetector CT imaging of the abdomen and pelvis was performed following the standard protocol without IV contrast. COMPARISON:  CT 03/06/2017 FINDINGS: Lower chest: Mild bibasilar atelectasis.  Trace pericardial fluid. Hepatobiliary: No focal hepatic lesion allowing for lack contrast. Gallbladder physiologically distended, no calcified stone. No biliary dilatation. Pancreas: No ductal dilatation or inflammation. Spleen: Normal in size without focal abnormality. Adrenals/Urinary Tract: No adrenal nodule. No hydronephrosis or perinephric edema. No urolithiasis. Urinary bladder is physiologically distended without wall thickening. Stomach/Bowel: Stomach physiologically distended. No bowel inflammation, wall thickening or obstruction. Normal appendix. Small stool burden. Vascular/Lymphatic: Aorto bi-iliac atherosclerosis, age advanced. Abdominal or pelvic adenopathy. Reproductive: Intrauterine device appropriately positioned in the uterus. Low-density lesion in the cervix likely nabothian cyst, unchanged. No adnexal mass. Other: No  free air, free fluid, or intra-abdominal fluid collection. Small fat containing umbilical hernia. Musculoskeletal: Stable from prior exam. Mild degenerative change in the spine. Sclerosis about the iliac aspect of both sacroiliac joints, left greater than right. IMPRESSION: 1. No acute abnormality or explanation for nausea and vomiting. 2. Aortic and branch atherosclerosis, advanced for age. Aortic Atherosclerosis (ICD10-I70.0). Electronically Signed   By: Rubye OaksMelanie  Ehinger M.D.   On: 03/26/2017 05:36   Dg Chest Port 1 View  Result Date: 03/26/2017 CLINICAL DATA:  Dyspnea and vomiting x1 day EXAM: PORTABLE CHEST 1 VIEW COMPARISON:  Chest CT 03/11/2017, CXR 03/11/2017 FINDINGS: The heart size and mediastinal contours are within normal limits. Low lung volumes are present. Left basilar atelectasis noted. The visualized skeletal structures are unremarkable. IMPRESSION: No active disease. Left lower lobe atelectasis with low lung volumes. Electronically Signed   By: Tollie Ethavid  Kwon M.D.   On: 03/26/2017 02:08    Scheduled Meds: . amLODipine  10 mg Oral Daily  . enoxaparin (LOVENOX) injection  30 mg Subcutaneous Q24H  . insulin aspart  0-5 Units Subcutaneous QHS  . insulin aspart  0-9 Units Subcutaneous TID WC  . metoCLOPramide (REGLAN) injection  5 mg Intravenous Q6H  . metoprolol tartrate  25 mg Oral BID  . pantoprazole (PROTONIX) IV  40 mg Intravenous Q12H   Continuous Infusions: . sodium chloride 100 mL/hr at 03/27/17 1210     LOS: 1 day   Aditi Rovira, Scheryl MartenSTEPHEN K, MD [facility] Pager [phone]  If 7PM-7AM, please contact night-coverage [email or web address] Password [email or web address] 03/27/2017, 3:09 PM

## 2017-03-28 ENCOUNTER — Inpatient Hospital Stay (HOSPITAL_COMMUNITY): Payer: Medicare Other

## 2017-03-28 LAB — GLUCOSE, CAPILLARY
GLUCOSE-CAPILLARY: 146 mg/dL — AB (ref 65–99)
GLUCOSE-CAPILLARY: 79 mg/dL (ref 65–99)
Glucose-Capillary: 89 mg/dL (ref 65–99)
Glucose-Capillary: 145 mg/dL — ABNORMAL HIGH (ref 65–99)

## 2017-03-28 LAB — BASIC METABOLIC PANEL
ANION GAP: 10 (ref 5–15)
BUN: 22 mg/dL — ABNORMAL HIGH (ref 6–20)
CALCIUM: 8.9 mg/dL (ref 8.9–10.3)
CO2: 19 mmol/L — ABNORMAL LOW (ref 22–32)
CREATININE: 3.05 mg/dL — AB (ref 0.44–1.00)
Chloride: 114 mmol/L — ABNORMAL HIGH (ref 101–111)
GFR, EST AFRICAN AMERICAN: 20 mL/min — AB (ref >60)
GFR, EST NON AFRICAN AMERICAN: 17 mL/min — AB (ref >60)
Glucose, Bld: 97 mg/dL (ref 65–99)
Potassium: 3.9 mmol/L (ref 3.5–5.1)
Sodium: 143 mmol/L (ref 135–145)

## 2017-03-28 MED ORDER — SIMETHICONE 80 MG PO CHEW
80.0000 mg | CHEWABLE_TABLET | Freq: Once | ORAL | Status: AC
  Administered 2017-03-28: 80 mg via ORAL
  Filled 2017-03-28: qty 1

## 2017-03-28 NOTE — Progress Notes (Signed)
PROGRESS NOTE    Pamela Goodwin  ZOX:[id number] DOB: Oct 29, 1970 DOA: 03/26/2017 PCP: Kirstie PeriShah, Ashish, MD    Brief Narrative:  46 y.o. female with medical history significant of HTN, DM type II, and pancreatitis; who presents with complaints of nausea and vomiting. History is obtained from the family members as the patient does not really give information. The patient just recently had been admitted into the hospital on 7/6-7/10 diagnosed with acute renal failure. Family notes that the patient had no previous history of kidney problems prior to that and that she follows with her primary care doctor routinely. [facility] that [facility], but details are unclear surrounding this.  Since being home family notes that the patient has had a significantly poor appetite and in the last 3-4 days has had persistent nausea and vomiting unable to keep any food or liquids or medications down. She is complaining of associated symptoms of epigastric like abdominal pain for which they thought she had a recurrence of pancreatitis. She follows with her primary care doctor and he had scheduled her an appointment with the nephrologist on 7/23. Other associated symptoms include patient complaining of feeling hot and cold and urinary urgency with inability to void.  Assessment & Plan:   Principal Problem:   Intractable nausea and vomiting Active Problems:   Acute kidney injury superimposed on chronic kidney disease (HCC)   Abdominal pain   Hypertensive urgency   Leukocytosis   Hypercalcemia  Intractable nausea and vomiting: Acute.  - decreased BS on exam - Abd mildly distended - Cont antiemetics prn N/V  - Cont with IV fluids normal saline at 100 ml/hr - Gastric emptying study ordered, patient was unable to tolerate study, tentatively rescheduled GES to 7/24 - Family reports patient passing some flatus - Abd remains distended and generally tender. Have ordered  and reviewed abd xray. Findings of moderate amount of gas, no obstruction - Patient currently on TID reglan, increase to 4 times daily  Epigastric abdominal pain: Acute.  - CT scan of the abdomen/pelvis neg for acute process - suspecting gastroparesis as etiology of symptoms - GES was ordered per above  Acute kidney injury on chronic renal failure IV: Patient creatinine had trended down to 3.6 after her last hospitalization, but she presents with a creatinine of 4.18 with BUN 42. Previous FeNa was>3. - Labs reviewed. Renal function continues to improve with IVF hydration - Cont IVF as tolerated   Hypertensive urgency - Continue amlodipine and metoprolol when able -  Labetalol IV ordered for sBP >180 - Remains stable at present  Leukocytosis: WBC elevated at 11.9 at time of admit. Per review of records it appears that this is relatively unchanged from patient's last discharge note patient had been on steroids. - lactate noted to be less than 2 - Currently stable  Left lung atelectasis - Cont with incentive spirometry as tolerated - Remains stable at this time  Hypercalcemia: Patient's initial calcium level elevated at 11.7.  - normalized - Currently stable  Diabetes mellitus type 2: Appears well controlled as last hemoglobin A1c was noted to be 6.4 on 7/6. Patient's initial presenting glucose is 262 she is been unable to keep any of her diabetic medications down due to nausea and vomiting symptoms. - Hypoglycemic protocol - Amaryl currently on hold - Will continue patient with CBGs every before meals and at bedtime with sensitive a sliding scale insulin.  Obesity - Remains stable at present  Gerd ppx - Will continue patient on Protonix  DVT prophylaxis: Lovenox subQ Code Status: Full Family Communication: Pt in room, family at bedside Disposition Plan: Uncertain at this time  Consultants:     Procedures:     Antimicrobials: Anti-infectives    None       Subjective: Still complaining of mild abd pain  Objective: Vitals:   03/27/17 1735 03/27/17 2141 03/28/17 0553 03/28/17 1000  BP: (!) 166/92 (!) 161/92 135/86 (!) 153/87  Pulse: 92 100 98 (!) 106  Resp: 18 19 20 20   Temp: 98.5 F (36.9 C) 98.5 F (36.9 C) 98.3 F (36.8 C) 98.8 F (37.1 C)  TempSrc: Oral Oral Oral Oral  SpO2: 96% 100% 100% 100%  Weight:  71.4 kg (157 lb 6.5 oz)    Height:        Intake/Output Summary (Last 24 hours) at 03/28/17 1523 Last data filed at 03/28/17 0900  Gross per 24 hour  Intake             1560 ml  Output              350 ml  Net             1210 ml   Filed Weights   03/26/17 0528 03/27/17 2141  Weight: 71.3 kg (157 lb 3.2 oz) 71.4 kg (157 lb 6.5 oz)    Examination: General exam: Awake, laying in bed, in nad Respiratory system: Normal respiratory effort, no wheezing Cardiovascular system: regular rate, s1, s2 Gastrointestinal system: mildly distended, decreased Bs Central nervous system: CN2-12 grossly intact, strength intact Extremities: Perfused, no clubbing Skin: Normal skin turgor, no notable skin lesions seen Psychiatry: Mood normal // no visual hallucinations   Data Reviewed: I have personally reviewed following labs and imaging studies  CBC:  Recent Labs Lab 03/25/17 2329  WBC 11.9*  HGB 11.2*  HCT 33.7*  MCV 87.8  PLT 564*   Basic Metabolic Panel:  Recent Labs Lab 03/25/17 2329 03/26/17 0803 03/27/17 0257 03/28/17 0525  NA 140 144 146* 143  K 3.9 3.9 3.9 3.9  CL 100* 109 114* 114*  CO2 24 23 23  19*  GLUCOSE 262* 141* 105* 97  BUN 42* 37* 27* 22*  CREATININE 4.18* 3.61* 3.25* 3.05*  CALCIUM 11.7* 10.0 9.3 8.9   GFR: Estimated Creatinine Clearance: 20.6 mL/min (A) (by C-G formula based on SCr of 3.05 mg/dL (H)). Liver Function Tests:  Recent Labs Lab 03/25/17 2329  AST 18  ALT 19  ALKPHOS 37*  BILITOT 0.8  PROT 7.7  ALBUMIN 3.6    Recent Labs Lab 03/25/17 2329  LIPASE 43   No results  for input(s): AMMONIA in the last 168 hours. Coagulation Profile: No results for input(s): INR, PROTIME in the last 168 hours. Cardiac Enzymes: No results for input(s): CKTOTAL, CKMB, CKMBINDEX, TROPONINI in the last 168 hours. BNP (last 3 results) No results for input(s): PROBNP in the last 8760 hours. HbA1C: No results for input(s): HGBA1C in the last 72 hours. CBG:  Recent Labs Lab 03/27/17 1212 03/27/17 1659 03/27/17 2140 03/28/17 0754 03/28/17 1145  GLUCAP 88 92 95 89 146*   Lipid Profile: No results for input(s): CHOL, HDL, LDLCALC, TRIG, CHOLHDL, LDLDIRECT in the last 72 hours. Thyroid Function Tests: No results for input(s): TSH, T4TOTAL, FREET4, T3FREE, THYROIDAB in the last 72 hours. Anemia Panel: No results for input(s): VITAMINB12, FOLATE, FERRITIN, TIBC, IRON, RETICCTPCT in the last 72 hours. Sepsis Labs:  Recent Labs  Lab 03/26/17 0542  LATICACIDVEN 1.3    No results found for this or any previous visit (from the past 240 hour(s)).   Radiology Studies: Dg Abd Portable 1v  Result Date: 03/28/2017 CLINICAL DATA:  46 year old female with abdominal pain and distention for the past 2-3 weeks EXAM: PORTABLE ABDOMEN - 1 VIEW COMPARISON:  Reason prior CT abdomen/ pelvis 03/26/2017 FINDINGS: The bowel gas pattern is not obstructed. There is a moderate volume of gas within the colon. An IUD projects over the anatomic pelvis in the expected location. No evidence of organomegaly or abnormal calcification. Osseous structures are unremarkable. IMPRESSION: Negative. Electronically Signed   By: Malachy Moan M.D.   On: 03/28/2017 13:26    Scheduled Meds: . amLODipine  10 mg Oral Daily  . enoxaparin (LOVENOX) injection  30 mg Subcutaneous Q24H  . insulin aspart  0-5 Units Subcutaneous QHS  . insulin aspart  0-9 Units Subcutaneous TID WC  . metoCLOPramide (REGLAN) injection  5 mg Intravenous Q6H  . metoprolol tartrate  25 mg Oral BID  . pantoprazole (PROTONIX) IV  40  mg Intravenous Q12H   Continuous Infusions: . sodium chloride 75 mL/hr at 03/28/17 0859     LOS: 2 days   Kamarri Fischetti, Scheryl Marten, MD [facility] Pager [phone]  If 7PM-7AM, please contact night-coverage [email or web address] Password [facility] 03/28/2017, 3:23 PM

## 2017-03-29 LAB — BASIC METABOLIC PANEL
ANION GAP: 8 (ref 5–15)
BUN: 24 mg/dL — ABNORMAL HIGH (ref 6–20)
CHLORIDE: 114 mmol/L — AB (ref 101–111)
CO2: 19 mmol/L — ABNORMAL LOW (ref 22–32)
Calcium: 8.7 mg/dL — ABNORMAL LOW (ref 8.9–10.3)
Creatinine, Ser: 3.06 mg/dL — ABNORMAL HIGH (ref 0.44–1.00)
GFR calc Af Amer: 20 mL/min — ABNORMAL LOW (ref >60)
GFR, EST NON AFRICAN AMERICAN: 17 mL/min — AB (ref >60)
Glucose, Bld: 123 mg/dL — ABNORMAL HIGH (ref 65–99)
POTASSIUM: 3.6 mmol/L (ref 3.5–5.1)
SODIUM: 141 mmol/L (ref 135–145)

## 2017-03-29 LAB — GLUCOSE, CAPILLARY
GLUCOSE-CAPILLARY: 118 mg/dL — AB (ref 65–99)
GLUCOSE-CAPILLARY: 101 mg/dL — AB (ref 65–99)
GLUCOSE-CAPILLARY: 105 mg/dL — AB (ref 65–99)
GLUCOSE-CAPILLARY: 138 mg/dL — AB (ref 65–99)

## 2017-03-29 MED ORDER — METOCLOPRAMIDE HCL 5 MG/ML IJ SOLN
10.0000 mg | Freq: Four times a day (QID) | INTRAMUSCULAR | Status: DC
  Administered 2017-03-30 – 2017-03-31 (×5): 10 mg via INTRAVENOUS
  Filled 2017-03-29 (×4): qty 2

## 2017-03-29 NOTE — Progress Notes (Signed)
MD notified that pt requires a diet and will need to be npo after midnight tonight. Awaiting orders

## 2017-03-29 NOTE — Progress Notes (Signed)
PROGRESS NOTE    Pamela Goodwin  HQI:[id number] DOB: 07-29-1971 DOA: 03/26/2017 PCP: [name], MD    Brief Narrative:  46 y.o. female with medical history significant of HTN, DM type II, and pancreatitis; who presents with complaints of nausea and vomiting. History is obtained from the family members as the patient does not really give information. The patient just recently had been admitted into the hospital on 7/6-7/10 diagnosed with acute renal failure. Family notes that the patient had no previous history of kidney problems prior to that and that she follows with her primary care doctor routinely. Before that the patient had been admitted into hospital in [facility] for pancreatitis, but details are unclear surrounding this.  Since being home family notes that the patient has had a significantly poor appetite and in the last 3-4 days has had persistent nausea and vomiting unable to keep any food or liquids or medications down. She is complaining of associated symptoms of epigastric like abdominal pain for which they thought she had a recurrence of pancreatitis. She follows with her primary care doctor and he had scheduled her an appointment with the nephrologist on 7/23. Other associated symptoms include patient complaining of feeling hot and cold and urinary urgency with inability to void.  Assessment & Plan:   Principal Problem:   Intractable nausea and vomiting Active Problems:   Acute kidney injury superimposed on chronic kidney disease (HCC)   Abdominal pain   Hypertensive urgency   Leukocytosis   Hypercalcemia  Intractable nausea and vomiting: Acute.  - decreased BS on exam - Abd mildly distended - Cont antiemetics prn N/V  - Cont with IV fluids normal saline at 100 ml/hr - Family reports patient passing some flatus - Abd remains distended and generally tender. Have ordered and reviewed abd xray. Findings of moderate amount of gas, no obstruction - Patient remains on reglan 4 times  daily. Still symptomatic. Will increase to 10mg  dosing -GES ordered, planned for 7/25  Epigastric abdominal pain: Acute.  - CT scan of the abdomen/pelvis neg for acute process - suspecting gastroparesis as etiology of symptoms - Gastric emptying study ordered, pending  Acute kidney injury on chronic renal failure IV: Patient creatinine had trended down to 3.6 after her last hospitalization, but she presents with a creatinine of 4.18 with BUN 42. Previous FeNa was>3. - Labs reviewed. Renal function continues to improve with IVF hydration - stable today   Hypertensive urgency - Continue amlodipine and metoprolol when able -  Labetalol IV ordered for sBP >180 -  Currently stable  Leukocytosis: WBC elevated at 11.9 at time of admit. Per review of records it appears that this is relatively unchanged from patient's last discharge note patient had been on steroids. - lactate noted to be less than 2 - remains stable  Left lung atelectasis - Cont with incentive spirometry as tolerated - currently stable  Hypercalcemia: Patient's initial calcium level elevated at 11.7.  - normalized - presently stable  Diabetes mellitus type 2: Appears well controlled as last hemoglobin A1c was noted to be 6.4 on 7/6. Patient's initial presenting glucose is 262 she is been unable to keep any of her diabetic medications down due to nausea and vomiting symptoms. - Hypoglycemic protocol - Amaryl currently on hold - continue SSI coverage  Obesity - Currently stable  Gerd ppx - Patient continued on PPI  DVT prophylaxis: Lovenox subQ Code Status: Full Family Communication: Pt in room, family at bedside Disposition Plan: Uncertain at this time  Consultants:     Procedures:     Antimicrobials: Anti-infectives    None      Subjective: Reporting abd pain  Objective: Vitals:   03/28/17 2117 03/29/17 0432 03/29/17 0900 03/29/17 1700  BP: (!) 154/98 (!) 154/100 (!) 157/102 (!)  144/105  Pulse: 100 (!) 109 (!) 116 (!) 118  Resp: 19 17 18 18   Temp: 98.2 F (36.8 C) 98.6 F (37 C) 97.8 F (36.6 C) 97.9 F (36.6 C)  TempSrc: Oral Oral Oral Oral  SpO2: 96% 98% 100% 97%  Weight: 71.8 kg (158 lb 4.6 oz)     Height:        Intake/Output Summary (Last 24 hours) at 03/29/17 1932 Last data filed at 03/29/17 1100  Gross per 24 hour  Intake                0 ml  Output              350 ml  Net             -350 ml   Filed Weights   03/26/17 0528 03/27/17 2141 03/28/17 2117  Weight: 71.3 kg (157 lb 3.2 oz) 71.4 kg (157 lb 6.5 oz) 71.8 kg (158 lb 4.6 oz)    Examination: General exam: Conversant, in no acute distress Respiratory system: normal chest rise, clear, no audible wheezing Cardiovascular system: regular rhythm, s1-s2 Gastrointestinal system: Nondistended, nontender, pos BS Central nervous system: No seizures, no tremors Extremities: No cyanosis, no joint deformities Skin: No rashes, no pallor Psychiatry: Affect normal // no auditory hallucinations   Data Reviewed: I have personally reviewed following labs and imaging studies  CBC:  Recent Labs Lab 03/25/17 2329  WBC 11.9*  HGB 11.2*  HCT 33.7*  MCV 87.8  PLT 564*   Basic Metabolic Panel:  Recent Labs Lab 03/25/17 2329 03/26/17 0803 03/27/17 0257 03/28/17 0525 03/29/17 0437  NA 140 144 146* 143 141  K 3.9 3.9 3.9 3.9 3.6  CL 100* 109 114* 114* 114*  CO2 24 23 23  19* 19*  GLUCOSE 262* 141* 105* 97 123*  BUN 42* 37* 27* 22* 24*  CREATININE 4.18* 3.61* 3.25* 3.05* 3.06*  CALCIUM 11.7* 10.0 9.3 8.9 8.7*   GFR: Estimated Creatinine Clearance: 20.5 mL/min (A) (by C-G formula based on SCr of 3.06 mg/dL (H)). Liver Function Tests:  Recent Labs Lab 03/25/17 2329  AST 18  ALT 19  ALKPHOS 37*  BILITOT 0.8  PROT 7.7  ALBUMIN 3.6    Recent Labs Lab 03/25/17 2329  LIPASE 43   No results for input(s): AMMONIA in the last 168 hours. Coagulation Profile: No results for input(s):  INR, PROTIME in the last 168 hours. Cardiac Enzymes: No results for input(s): CKTOTAL, CKMB, CKMBINDEX, TROPONINI in the last 168 hours. BNP (last 3 results) No results for input(s): PROBNP in the last 8760 hours. HbA1C: No results for input(s): HGBA1C in the last 72 hours. CBG:  Recent Labs Lab 03/28/17 1626 03/28/17 2115 03/29/17 0757 03/29/17 1213 03/29/17 1726  GLUCAP 79 145* 118* 101* 105*   Lipid Profile: No results for input(s): CHOL, HDL, LDLCALC, TRIG, CHOLHDL, LDLDIRECT in the last 72 hours. Thyroid Function Tests: No results for input(s): TSH, T4TOTAL, FREET4, T3FREE, THYROIDAB in the last 72 hours. Anemia Panel: No results for input(s): VITAMINB12, FOLATE, FERRITIN, TIBC, IRON, RETICCTPCT in the last 72 hours. Sepsis Labs:  Recent Labs Lab 03/26/17 0542  LATICACIDVEN 1.3    No results found for  this or any previous visit (from the past 240 hour(s)).   Radiology Studies: Dg Abd Portable 1v  Result Date: 03/28/2017 CLINICAL DATA:  46 year old female with abdominal pain and distention for the past 2-3 weeks EXAM: PORTABLE ABDOMEN - 1 VIEW COMPARISON:  Reason prior CT abdomen/ pelvis 03/26/2017 FINDINGS: The bowel gas pattern is not obstructed. There is a moderate volume of gas within the colon. An IUD projects over the anatomic pelvis in the expected location. No evidence of organomegaly or abnormal calcification. Osseous structures are unremarkable. IMPRESSION: Negative. Electronically Signed   By: Malachy Moan M.D.   On: 03/28/2017 13:26    Scheduled Meds: . amLODipine  10 mg Oral Daily  . enoxaparin (LOVENOX) injection  30 mg Subcutaneous Q24H  . insulin aspart  0-5 Units Subcutaneous QHS  . insulin aspart  0-9 Units Subcutaneous TID WC  . metoCLOPramide (REGLAN) injection  5 mg Intravenous Q6H  . metoprolol tartrate  25 mg Oral BID  . pantoprazole (PROTONIX) IV  40 mg Intravenous Q12H   Continuous Infusions: . sodium chloride 75 mL/hr at 03/29/17  1438     LOS: 3 days   Pamela Goodwin, Scheryl Marten, MD [facility] Pager [phone]  If 7PM-7AM, please contact night-coverage [email or web address] Password [facility] 03/29/2017, 7:32 PM

## 2017-03-29 NOTE — Progress Notes (Signed)
Pt having gastric emptying tomorrow. Cannot have any meds that affect gastric motility

## 2017-03-29 NOTE — Progress Notes (Signed)
Pt cannot have zofran, protonix or reglan before she has the gastric emptying study. Pt can have these meds before she becomes npo for the study but once she begins the npo status, do not administer any of these meds. Blood pressure meds can be administered

## 2017-03-30 ENCOUNTER — Inpatient Hospital Stay (HOSPITAL_COMMUNITY): Payer: Medicare Other

## 2017-03-30 DIAGNOSIS — R109 Unspecified abdominal pain: Secondary | ICD-10-CM

## 2017-03-30 DIAGNOSIS — K3184 Gastroparesis: Secondary | ICD-10-CM

## 2017-03-30 DIAGNOSIS — D72829 Elevated white blood cell count, unspecified: Secondary | ICD-10-CM

## 2017-03-30 DIAGNOSIS — I160 Hypertensive urgency: Secondary | ICD-10-CM

## 2017-03-30 DIAGNOSIS — N179 Acute kidney failure, unspecified: Principal | ICD-10-CM

## 2017-03-30 DIAGNOSIS — E860 Dehydration: Secondary | ICD-10-CM

## 2017-03-30 DIAGNOSIS — N189 Chronic kidney disease, unspecified: Secondary | ICD-10-CM

## 2017-03-30 DIAGNOSIS — G43A1 Cyclical vomiting, intractable: Secondary | ICD-10-CM

## 2017-03-30 DIAGNOSIS — R112 Nausea with vomiting, unspecified: Secondary | ICD-10-CM

## 2017-03-30 LAB — BASIC METABOLIC PANEL
Anion gap: 5 (ref 5–15)
BUN: 24 mg/dL — ABNORMAL HIGH (ref 6–20)
CALCIUM: 8.4 mg/dL — AB (ref 8.9–10.3)
CHLORIDE: 116 mmol/L — AB (ref 101–111)
CO2: 22 mmol/L (ref 22–32)
CREATININE: 3.01 mg/dL — AB (ref 0.44–1.00)
GFR calc non Af Amer: 18 mL/min — ABNORMAL LOW (ref >60)
GFR, EST AFRICAN AMERICAN: 20 mL/min — AB (ref >60)
Glucose, Bld: 111 mg/dL — ABNORMAL HIGH (ref 65–99)
Potassium: 3.9 mmol/L (ref 3.5–5.1)
SODIUM: 143 mmol/L (ref 135–145)

## 2017-03-30 LAB — GLUCOSE, CAPILLARY
GLUCOSE-CAPILLARY: 100 mg/dL — AB (ref 65–99)
GLUCOSE-CAPILLARY: 160 mg/dL — AB (ref 65–99)
Glucose-Capillary: 137 mg/dL — ABNORMAL HIGH (ref 65–99)

## 2017-03-30 MED ORDER — TECHNETIUM TC 99M SULFUR COLLOID
2.0000 | Freq: Once | INTRAVENOUS | Status: AC | PRN
  Administered 2017-03-30: 2 via INTRAVENOUS

## 2017-03-30 NOTE — Progress Notes (Signed)
PROGRESS NOTE    Pamela Goodwin  WUJ:[id number] DOB: March 03, 1971 DOA: 03/26/2017 PCP: Kirstie PeriShah, Ashish, MD   Chief Complaint  Patient presents with  . Abdominal Pain  . Emesis    Brief Narrative:  HPI on 03/26/2017 by Dr. Madelyn Flavorsondell Smith Pamela Goodwin is a 46 y.o. female with medical history significant of HTN, DM type II, and pancreatitis; who presents with complaints of nausea and vomiting. History is obtained from the family members as the patient does not really give information. The patient just recently had been admitted into the hospital on 7/6-7/10 diagnosed with acute renal failure. Family notes that the patient had no previous history of kidney problems prior to that and that she follows with her primary care doctor routinely. [facility] that [facility], but details are unclear surrounding this.  Since being home family notes that the patient has had a significantly poor appetite and in the last 3-4 days has had persistent nausea and vomiting unable to keep any food or liquids or medications down. She is complaining of associated symptoms of epigastric like abdominal pain for which they thought she had a recurrence of pancreatitis. She follows with her primary care doctor and he had scheduled her an appointment with the nephrologist on 7/23. Other associated symptoms include patient complaining of feeling hot and cold and urinary urgency with inability to void.  Assessment & Plan   Acute Intractable nausea/vomiting with abdominal pain -Decreased bowel sounds. Mildly distended -Likely gastroparesis as patient also has history of diabetes mellitus -gastric emptying study  -CT abdomen/pelvis negative acute process -Patient states she is feeling mildly better today and would like to eat and feels hungry -Was placed on Reglan, increase to 10 mg 4 times daily -Abdomen does appear to be distended however nontender on exam -Continue antiemetics  as needed -Will place patient on full liquid diet and continue to monitor  Acute kidney injury on chronic kidney disease, stage IV -Creatinine was down to 3.6 on her past hospitalization -Upon admission, creatinine was 4.18 -Currently 3.06 -Continue IV fluids and monitor BMP  Hypertensive urgency -Continue amlodipine, metoprolol -Continue labetalol IV as needed  Leukocytosis -Patient with a PVC of 11.9 upon admission -Possibly secondary to steroids that she was discharged on  Left lung atelectasis -Continue incentive spirometry  Hypercalcemia -Stable, has normalized  Diabetes mellitus, type II -Hemoglobin A1c was 6.4 on 03/11/2017 -Continue insulin sliding scale CBG monitoring -Amaryl held  Obesity -Follow-up with PCP for lifestyle modifications on discharge  GERD -Continue PPI  Constipation -Question was patient has not been on a regular diet rate and solid food in several days. -LAD on MiraLAX and continue to monitor  DVT Prophylaxis  Lovenox  Code Status: Full  Family Communication: Husband at bedside  Disposition Plan: Admitted, will continue to advance patient's diet as tolerated  Consultants None  Procedures  Gastric emptying study  Antibiotics   Anti-infectives    None      Subjective:   Pamela Goodwin seen and examined today.  Patient sleepy this afternoon. Currently denies any nausea or vomiting or abdominal pain. States she feels very hungry. Denies chest pain or shortness of breath. Has not had a bowel movement for 5 days. However has not been eating solid food.   Objective:   Vitals:   03/29/17 1700 03/29/17 2131 03/30/17 0431 03/30/17 1300  BP: (!) 144/105 (!) 154/99 (!) 148/88 (!) 162/103  Pulse: (!) 118 (!) 105 100 (!) 103  Resp: 18 17 17 16   Temp: 97.9 F (36.6 C) 97.8 F (36.6 C) 97.9 F (36.6 C)   TempSrc: Oral Oral Oral   SpO2: 97% 97% 91% 98%  Weight:  71.5 kg (157 lb 10.1 oz)    Height:        Intake/Output Summary  (Last 24 hours) at 03/30/17 1632 Last data filed at 03/30/17 1425  Gross per 24 hour  Intake          3923.58 ml  Output                0 ml  Net          3923.58 ml   Filed Weights   03/27/17 2141 03/28/17 2117 03/29/17 2131  Weight: 71.4 kg (157 lb 6.5 oz) 71.8 kg (158 lb 4.6 oz) 71.5 kg (157 lb 10.1 oz)    Exam  General: Well developed, well nourished, NAD  HEENT: NCAT, mucous membranes moist.   Cardiovascular: S1 S2 auscultated, no rubs, murmurs or gallops. Regular rate and rhythm.  Respiratory: Clear to auscultation bilaterally with equal chest rise  Abdomen: Soft, obese, nontender, distended, +hypoactive bowel sounds  Extremities: warm dry without cyanosis clubbing or edema  Neuro: AAOx3, Nonfocal, falls asleep easily  Psych: Appropriate   Data Reviewed: I have personally reviewed following labs and imaging studies  CBC:  Recent Labs Lab 03/25/17 2329  WBC 11.9*  HGB 11.2*  HCT 33.7*  MCV 87.8  PLT 564*   Basic Metabolic Panel:  Recent Labs Lab 03/26/17 0803 03/27/17 0257 03/28/17 0525 03/29/17 0437 03/30/17 0615  NA 144 146* 143 141 143  K 3.9 3.9 3.9 3.6 3.9  CL 109 114* 114* 114* 116*  CO2 23 23 19* 19* 22  GLUCOSE 141* 105* 97 123* 111*  BUN 37* 27* 22* 24* 24*  CREATININE 3.61* 3.25* 3.05* 3.06* 3.01*  CALCIUM 10.0 9.3 8.9 8.7* 8.4*   GFR: Estimated Creatinine Clearance: 20.8 mL/min (A) (by C-G formula based on SCr of 3.01 mg/dL (H)). Liver Function Tests:  Recent Labs Lab 03/25/17 2329  AST 18  ALT 19  ALKPHOS 37*  BILITOT 0.8  PROT 7.7  ALBUMIN 3.6    Recent Labs Lab 03/25/17 2329  LIPASE 43   No results for input(s): AMMONIA in the last 168 hours. Coagulation Profile: No results for input(s): INR, PROTIME in the last 168 hours. Cardiac Enzymes: No results for input(s): CKTOTAL, CKMB, CKMBINDEX, TROPONINI in the last 168 hours. BNP (last 3 results) No results for input(s): PROBNP in the last 8760 hours. HbA1C: No  results for input(s): HGBA1C in the last 72 hours. CBG:  Recent Labs Lab 03/29/17 0757 03/29/17 1213 03/29/17 1726 03/29/17 2130 03/30/17 1309  GLUCAP 118* 101* 105* 138* 100*   Lipid Profile: No results for input(s): CHOL, HDL, LDLCALC, TRIG, CHOLHDL, LDLDIRECT in the last 72 hours. Thyroid Function Tests: No results for input(s): TSH, T4TOTAL, FREET4, T3FREE, THYROIDAB in the last 72 hours. Anemia Panel: No results for input(s): VITAMINB12, FOLATE, FERRITIN, TIBC, IRON, RETICCTPCT in the last 72 hours. Urine analysis:    Component Value Date/Time   COLORURINE YELLOW 03/26/2017 0420   APPEARANCEUR CLEAR 03/26/2017 0420   LABSPEC 1.020 03/26/2017 0420   PHURINE 6.0 03/26/2017 0420   GLUCOSEU NEGATIVE 03/26/2017 0420   HGBUR MODERATE (A) 03/26/2017 0420   BILIRUBINUR NEGATIVE 03/26/2017 0420   KETONESUR 15 (A) 03/26/2017 0420   PROTEINUR >300 (A) 03/26/2017 0420   NITRITE NEGATIVE 03/26/2017 0420  LEUKOCYTESUR NEGATIVE 03/26/2017 0420   Sepsis Labs: @LABRCNTIP (procalcitonin:4,lacticidven:4)  )No results found for this or any previous visit (from the past 240 hour(s)).    Radiology Studies: Nm Gastric Emptying  Result Date: 03/30/2017 CLINICAL DATA:  Gastroparesis. EXAM: NUCLEAR MEDICINE GASTRIC EMPTYING SCAN TECHNIQUE: After oral ingestion of radiolabeled meal, sequential abdominal images were obtained for 4 hours. Percentage of activity emptying the stomach was calculated at 1 hour, 2 hour, 3 hour, and 4 hours. RADIOPHARMACEUTICALS:  2.0 mCi Tc-3330m sulfur colloid in standardized meal COMPARISON:  None. FINDINGS: Expected location of the stomach in the left upper quadrant. Ingested meal empties the stomach gradually over the course of the study. 8% emptied at 1 hr ( normal >= 10%) 1% emptied at 2 hr ( normal >= 40%) 10% emptied at 3 hr ( normal >= 70%) 26% emptied at 4 hr ( normal >= 90%) IMPRESSION: Delayed gastric emptying study.  Only 26% emptying at 4 hours.  Electronically Signed   By: Francene BoyersJames  Maxwell M.D.   On: 03/30/2017 12:58     Scheduled Meds: . amLODipine  10 mg Oral Daily  . enoxaparin (LOVENOX) injection  30 mg Subcutaneous Q24H  . insulin aspart  0-5 Units Subcutaneous QHS  . insulin aspart  0-9 Units Subcutaneous TID WC  . metoCLOPramide (REGLAN) injection  10 mg Intravenous Q6H  . metoprolol tartrate  25 mg Oral BID  . pantoprazole (PROTONIX) IV  40 mg Intravenous Q12H   Continuous Infusions: . sodium chloride 75 mL/hr at 03/29/17 1438     LOS: 4 days   Time Spent in minutes   30 minutes  Annalea Alguire D.O. on 03/30/2017 at 4:32 PM  Between 7am to 7pm - Pager - [phone]  After 7pm go to [email or web address] - password [email or web address]  And look for the night coverage person covering for me after hours  [facility] Office  [phone]

## 2017-03-31 ENCOUNTER — Institutional Professional Consult (permissible substitution): Payer: Medicare Other | Admitting: Emergency Medicine

## 2017-03-31 LAB — BASIC METABOLIC PANEL
ANION GAP: 6 (ref 5–15)
BUN: 22 mg/dL — ABNORMAL HIGH (ref 6–20)
CHLORIDE: 115 mmol/L — AB (ref 101–111)
CO2: 19 mmol/L — ABNORMAL LOW (ref 22–32)
Calcium: 8.5 mg/dL — ABNORMAL LOW (ref 8.9–10.3)
Creatinine, Ser: 3.01 mg/dL — ABNORMAL HIGH (ref 0.44–1.00)
GFR calc Af Amer: 20 mL/min — ABNORMAL LOW (ref >60)
GFR, EST NON AFRICAN AMERICAN: 18 mL/min — AB (ref >60)
GLUCOSE: 115 mg/dL — AB (ref 65–99)
POTASSIUM: 3.8 mmol/L (ref 3.5–5.1)
SODIUM: 140 mmol/L (ref 135–145)

## 2017-03-31 LAB — GLUCOSE, CAPILLARY
GLUCOSE-CAPILLARY: 100 mg/dL — AB (ref 65–99)
Glucose-Capillary: 113 mg/dL — ABNORMAL HIGH (ref 65–99)

## 2017-03-31 LAB — CBC
HCT: 28.9 % — ABNORMAL LOW (ref 36.0–46.0)
HEMOGLOBIN: 9.3 g/dL — AB (ref 12.0–15.0)
MCH: 27.5 pg (ref 26.0–34.0)
MCHC: 32.2 g/dL (ref 30.0–36.0)
MCV: 85.5 fL (ref 78.0–100.0)
PLATELETS: 192 10*3/uL (ref 150–400)
RBC: 3.38 MIL/uL — AB (ref 3.87–5.11)
RDW: 15.1 % (ref 11.5–15.5)
WBC: 7.7 10*3/uL (ref 4.0–10.5)

## 2017-03-31 MED ORDER — ONDANSETRON HCL 4 MG PO TABS: 4.0000 mg | ORAL_TABLET | Freq: Four times a day (QID) | ORAL | 0 refills | Status: AC | PRN

## 2017-03-31 MED ORDER — PANTOPRAZOLE SODIUM 40 MG PO TBEC
40.0000 mg | DELAYED_RELEASE_TABLET | Freq: Two times a day (BID) | ORAL | Status: DC
  Administered 2017-03-31: 40 mg via ORAL
  Filled 2017-03-31: qty 1

## 2017-03-31 MED ORDER — POLYETHYLENE GLYCOL 3350 17 G PO PACK: 17.0000 g | PACK | Freq: Every day | ORAL | Status: DC | PRN

## 2017-03-31 MED ORDER — AMLODIPINE BESYLATE 10 MG PO TABS: 10.0000 mg | ORAL_TABLET | Freq: Every day | ORAL | 0 refills | Status: AC

## 2017-03-31 MED ORDER — METOPROLOL TARTRATE 25 MG PO TABS: 25.0000 mg | ORAL_TABLET | Freq: Two times a day (BID) | ORAL | 0 refills | Status: AC

## 2017-03-31 MED ORDER — POLYETHYLENE GLYCOL 3350 17 G PO PACK: 17.0000 g | PACK | Freq: Every day | ORAL | Status: DC

## 2017-03-31 MED ORDER — METOCLOPRAMIDE HCL 10 MG PO TABS: 10.0000 mg | ORAL_TABLET | Freq: Three times a day (TID) | ORAL | 1 refills | Status: DC | PRN

## 2017-03-31 MED ORDER — PANTOPRAZOLE SODIUM 40 MG PO TBEC: 40.0000 mg | DELAYED_RELEASE_TABLET | Freq: Two times a day (BID) | ORAL | 0 refills | Status: AC

## 2017-03-31 NOTE — Care Management Note (Signed)
Case Management Note  Patient Details  Name: Pamela Goodwin MRN: [id number] Date of Birth: 02/02/1971  Subjective/Objective:     CM following for progression and d/c planning.                Action/Plan: 03/31/2017 Per PT this pt would benefit form HHPT and HHOT, orders entered and this CM spoke with pt father is pt caregiver. [facility]. Per father this pt will need a rolling walker, [facility]   Expected Discharge Date:  03/31/17               Expected Discharge Plan:  Home w Home Health Services  In-House Referral:  NA  Discharge planning Services  CM Consult  Post Acute Care Choice:  Durable Medical Equipment, Home Health Choice offered to:  Parent  DME Arranged:  Dan HumphreysWalker rolling DME Agency:  [facility]  HH Arranged[facility]  [facility]  [facility]  Status of Service:  Completed, signed off  If discussed at Long Length of Stay Meetings, dates discussed:    Additional Comments:  Starlyn SkeansRoyal, Conan Mcmanaway U, RN 03/31/2017, 4:25 PM

## 2017-03-31 NOTE — Evaluation (Addendum)
Physical Therapy Evaluation Patient Details Name: Pamela Goodwin MRN: [id number] DOB: June 03, 1971 Today's Date: 03/31/2017   History of Present Illness  Pt is a 46 y/o female admitted secondary to intractable nausea/vomiting. Had gastric emptying study 7/25 which showed delayed gastric emptying. PMH includes disabled, prior TIA, prior seizures, DM, HTN, CKD, and HTN.   Clinical Impression  Pt admitted secondary to problem above with deficits below. PTA, pt was requiring more assist at home secondary to sickness, but was independent prior to that. Upon eval, pt limited secondary to RLE pain, weakness, dizziness, and decreased balance. Pt requiring from min to min guard assist for safety during ambulation. Educated to use RW to increase stability with gait at home. Educated about follow up recommendations as well. Recommending OT eval to assess safety with ADLs, as pt reporting increased difficulty with shower transfers. Will continue to follow acutely to maximize functional mobility independence.     Follow Up Recommendations Home health PT (HHOT )    Equipment Recommendations  Rolling walker with 5" wheels    Recommendations for Other Services OT consult     Precautions / Restrictions Precautions Precautions: Fall Restrictions Weight Bearing Restrictions: No      Mobility  Bed Mobility Overal bed mobility: Needs Assistance Bed Mobility: Supine to Sit;Sit to Supine     Supine to sit: Min assist Sit to supine: Supervision   General bed mobility comments: Min A for trunk elevation.   Transfers Overall transfer level: Needs assistance Equipment used: Rolling walker (2 wheeled);None Transfers: Sit to/from Stand Sit to Stand: Min assist;Min guard         General transfer comment: Attempted standing without AD, however, pt reporting dizziness upon standing and reached out for UE support to maintain balance. Required min A to maintain balance. Min guard for sit<>Stand once using  RW. Verbal cues for safe hand placement.   Ambulation/Gait Ambulation/Gait assistance: Min guard;Min assist Ambulation Distance (Feet): 200 Feet Assistive device: Rolling walker (2 wheeled);1 person hand held assist Gait Pattern/deviations: Step-through pattern;Decreased stride length;Drifts right/left Gait velocity: Decreased Gait velocity interpretation: Below normal speed for age/gender General Gait Details: Slow, guarded gait. Reporting pain in RLE (reports this happens at baseline). Toe out on the R as well at baseline. Min guard with one instance of Min A secondary to LOB during gait. Distance limited secondary to foot pain.   Stairs            Wheelchair Mobility    Modified Rankin (Stroke Patients Only)       Balance Overall balance assessment: Needs assistance Sitting-balance support: No upper extremity supported;Feet supported Sitting balance-Leahy Scale: Fair     Standing balance support: Bilateral upper extremity supported;No upper extremity supported;During functional activity Standing balance-Leahy Scale: Poor Standing balance comment: Required min A to standing without AD secondary to LOB, required BUE support                              Pertinent Vitals/Pain Pain Assessment: Faces Faces Pain Scale: Hurts even more Pain Location: RLE  Pain Descriptors / Indicators: Grimacing Pain Intervention(s): Limited activity within patient's tolerance;Monitored during session;Repositioned    Home Living Family/patient expects to be discharged to:: Private residence Living Arrangements: Parent;Other relatives Available Help at Discharge: Family;Available 24 hours/day Type of Home: House Home Access: Stairs to enter Entrance Stairs-Rails: None Entrance Stairs-Number of Steps: 3 Home Layout: Two level;Able to live on main level with bedroom/bathroom Home Equipment:  Bedside commode      Prior Function Level of Independence: Needs assistance   Gait  / Transfers Assistance Needed: Reports she needed help with getting out of bed and standing ever since she got sick.   ADL's / Homemaking Assistance Needed: Needed help getting into and out of shower.   Comments: Reports she was independent prior to getting sick.      Hand Dominance   Dominant Hand: Right    Extremity/Trunk Assessment   Upper Extremity Assessment Upper Extremity Assessment: Generalized weakness    Lower Extremity Assessment Lower Extremity Assessment: Generalized weakness (Grossly 4/5 throughout )    Cervical / Trunk Assessment Cervical / Trunk Assessment: Normal  Communication   Communication: No difficulties  Cognition Arousal/Alertness: Awake/alert Behavior During Therapy: WFL for tasks assessed/performed Overall Cognitive Status: Within Functional Limits for tasks assessed                                        General Comments General comments (skin integrity, edema, etc.): Unable to perform stair navigation secondary to RLE pain. Educated pt and family about appropriate LE sequencing when navigating stairs. Educated pt's father about assist level with mobility at home. Educated about need for RW and HHPT. Pt reports she also has issues with bathing/dressing, therefore recommending HHOT as well.     Exercises     Assessment/Plan    PT Assessment Patient needs continued PT services  PT Problem List Decreased activity tolerance;Decreased balance;Decreased mobility;Decreased coordination;Decreased knowledge of use of DME;Decreased safety awareness;Decreased knowledge of precautions;Decreased strength;Pain       PT Treatment Interventions DME instruction;Gait training;Stair training;Functional mobility training;Therapeutic activities;Therapeutic exercise;Balance training;Neuromuscular re-education;Patient/family education    PT Goals (Current goals can be found in the Care Plan section)  Acute Rehab PT Goals Patient Stated Goal: to  feel better  PT Goal Formulation: With patient Time For Goal Achievement: 04/14/17 Potential to Achieve Goals: Good    Frequency Min 3X/week   Barriers to discharge        Co-evaluation               AM-PAC PT "6 Clicks" Daily Activity  Outcome Measure Difficulty turning over in bed (including adjusting bedclothes, sheets and blankets)?: A Little Difficulty moving from lying on back to sitting on the side of the bed? : Total Difficulty sitting down on and standing up from a chair with arms (e.g., wheelchair, bedside commode, etc,.)?: Total Help needed moving to and from a bed to chair (including a wheelchair)?: A Little Help needed walking in hospital room?: A Little Help needed climbing 3-5 steps with a railing? : A Lot 6 Click Score: 13    End of Session Equipment Utilized During Treatment: Gait belt Activity Tolerance: Patient tolerated treatment well Patient left: in bed;with call bell/phone within reach;with bed alarm set;with family/visitor present Nurse Communication: Mobility status PT Visit Diagnosis: Unsteadiness on feet (R26.81);Other abnormalities of gait and mobility (R26.89);Pain Pain - Right/Left: Right Pain - part of body: Leg    Time: 1450-1525 PT Time Calculation (min) (ACUTE ONLY): 35 min   Charges:   PT Evaluation $PT Eval Low Complexity: 1 Procedure PT Treatments $Gait Training: 8-22 mins   PT G Codes:        Gladys DammeBrittany Gael Londo, PT, DPT  Acute Rehabilitation Services  Pager: [phone]   Lehman PromBrittany S Dimitri Shakespeare 03/31/2017, 3:43 PM

## 2017-03-31 NOTE — Care Management Important Message (Signed)
Important Message  Patient Details  Name: Pamela Goodwin MRN: [id number] Date of Birth: 05/06/1971   Medicare Important Message Given:  Yes    Mikaya Bunner 03/31/2017, 11:40 AM

## 2017-03-31 NOTE — Discharge Instructions (Signed)
Gastroparesis °Gastroparesis, also called delayed gastric emptying, is a condition in which food takes longer than normal to empty from the stomach. The condition is usually long-lasting (chronic). °What are the causes? °This condition may be caused by: °· An endocrine disorder, such as hypothyroidism or diabetes. Diabetes is the most common cause of this condition. °· A nervous system disease, such as Parkinson disease or multiple sclerosis. °· Cancer, infection, or surgery of the stomach or vagus nerve. °· A connective tissue disorder, such as scleroderma. °· Certain medicines. ° °In most cases, the cause is not known. °What increases the risk? °This condition is more likely to develop in: °· People with certain disorders, including endocrine disorders, eating disorders, amyloidosis, and scleroderma. °· People with certain diseases, including Parkinson disease or multiple sclerosis. °· People with cancer or infection of the stomach or vagus nerve. °· People who have had surgery on the stomach or vagus nerve. °· People who take certain medicines. °· Women. ° °What are the signs or symptoms? °Symptoms of this condition include: °· An early feeling of fullness when eating. °· Nausea. °· Weight loss. °· Vomiting. °· Heartburn. °· Abdominal bloating. °· Inconsistent blood glucose levels. °· Lack of appetite. °· Acid from the stomach coming up into the esophagus (gastroesophageal reflux). °· Spasms of the stomach. ° °Symptoms may come and go. °How is this diagnosed? °This condition is diagnosed with tests, such as: °· Tests that check how long it takes food to move through the stomach and intestines. These tests include: °? Upper gastrointestinal (GI) series. In this test, X-rays of the intestines are taken after you drink a liquid. The liquid makes the intestines show up better on the X-rays. °? Gastric emptying scintigraphy. In this test, scans are taken after you eat food that contains a small amount of radioactive  material. °? Wireless capsule GI monitoring system. This test involves swallowing a capsule that records information about movement through the stomach. °· Gastric manometry. This test measures electrical and muscular activity in the stomach. It is done with a thin tube that is passed down the throat and into the stomach. °· Endoscopy. This test checks for abnormalities in the lining of the stomach. It is done with a long, thin tube that is passed down the throat and into the stomach. °· An ultrasound. This test can help rule out gallbladder disease or pancreatitis as a cause of your symptoms. It uses sound waves to take pictures of the inside of your body. ° °How is this treated? °There is no cure for gastroparesis. This condition may be managed with: °· Treatment of the underlying condition causing the gastroparesis. °· Lifestyle changes, including exercise and dietary changes. Dietary changes can include: °? Changes in what and when you eat. °? Eating smaller meals more often. °? Eating low-fat foods. °? Eating low-fiber forms of high-fiber foods, such as cooked vegetables instead of raw vegetables. °? Having liquid foods in place of solid foods. Liquid foods are easier to digest. °· Medicines. These may be given to control nausea and vomiting and to stimulate stomach muscles. °· Getting food through a feeding tube. This may be done in severe cases. °· A gastric neurostimulator. This is a device that is inserted into the body with surgery. It helps improve stomach emptying and control nausea and vomiting. ° °Follow these instructions at home: °· Follow your health care provider's instructions about exercise and diet. °· Take medicines only as directed by your health care provider. °Contact a   health care provider if: °· Your symptoms do not improve with treatment. °· You have new symptoms. °Get help right away if: °· You have severe abdominal pain that does not improve with treatment. °· You have nausea that does  not go away. °· You cannot keep fluids down. °This information is not intended to replace advice given to you by your health care provider. Make sure you discuss any questions you have with your health care provider. °Document Released: 08/23/2005 Document Revised: 01/29/2016 Document Reviewed: 08/19/2014 °[employer] Interactive Patient Education © 2018 [employer] ° °

## 2017-03-31 NOTE — Discharge Summary (Signed)
Pt given discharge instructions, prescriptions, and follow up info. Denies questions. Father to drive pt home. Home Health PT set up and walker given to pt.

## 2017-03-31 NOTE — Discharge Summary (Signed)
Physician Discharge Summary  Pamela Goodwin ZOX:[id number] DOB: 03-26-1971 DOA: 03/26/2017  PCP: [name], MD  Admit date: 03/26/2017 Discharge date: 03/31/2017  Time spent: 45 minutes  Recommendations for Outpatient Follow-up:  Patient will be discharged to home with home health physical and occupational therapy.  Patient will need to follow up with primary care provider within one week of discharge.  May consider gastroenterology follow up if symptoms persist. Patient should continue medications as prescribed.  Patient should follow a heart healthy/carb modified diet.   Discharge Diagnoses:  Acute Intractable nausea/vomiting with abdominal pain Acute kidney injury on chronic kidney disease, stage IV Hypertensive urgency Leukocytosis Left lung atelectasis Hypercalcemia Diabetes mellitus, type II Obesity GERD Constipation  Discharge Condition: Stable  Diet recommendation: heart healthy/carb modified  Filed Weights   03/28/17 2117 03/29/17 2131 03/31/17 0434  Weight: 71.8 kg (158 lb 4.6 oz) 71.5 kg (157 lb 10.1 oz) 71.6 kg (157 lb 13.6 oz)    History of present illness:  on 03/26/2017 by Dr. Madelyn Flavors Sim Goodwin a 46 y.o.femalewith medical historysignificant of HTN, DM type II, and pancreatitis; who presents with complaints of nausea and vomiting. History is obtained from the family members as the patient does not really give information. The patient just recently had been admitted into the hospital on 7/6-7/10diagnosed with acute renal failure. Family notes that the patient had no previous history of kidney problems prior to that and that she follows with her primary care doctor routinely. Before that the patient had been admitted into hospital in [facility] for pancreatitis,but details are unclear surrounding this. Since being home family notes that the patient has had a significantly poor appetite and in the last 3-4 days has had persistent nausea and vomiting unable to  keep any food or liquids or medications down. She is complaining of associated symptoms of epigastric like abdominal pain for which they thought she had a recurrence of pancreatitis. She follows with her primary care doctor and he had scheduled her an appointment with the nephrologist on 7/23.Other associated symptoms include patient complaining of feeling hot and cold and urinary urgency with inability to void.  Hospital Course:  Acute Intractable nausea/vomiting with abdominal pain -Decreased bowel sounds. Mildly distended -Likely gastroparesis as patient also has history of diabetes mellitus -gastric emptying study showed delayed gastric emptying, only 26% emptying at 4 hours -CT abdomen/pelvis negative acute process -Patient states she is feeling mildly better today and would like to eat and feels hungry -Was placed on Reglan, increased to 10 mg 4 times daily -Continue antiemetics as needed -Patient was placed on a soft diet and was able to tolerate well. Also had bowel movement. Currently denies any further nausea or vomiting, abdominal pain -Will discharge with reglan and zofran PRN  Acute kidney injury on chronic kidney disease, stage IV -Creatinine was down to 3.6 on her past hospitalization -Upon admission, creatinine was 4.18 -Currently 3.01 -Continue IV fluids and monitor BMP  Hypertensive urgency -Continue amlodipine, metoprolol  Leukocytosis -Patient with a WBC of 11.9 upon admission -Resolved, now 7.7  Left lung atelectasis -Continue incentive spirometry  Hypercalcemia -Stable, has normalized  Diabetes mellitus, type II -Hemoglobin A1c was 6.4 on 03/11/2017 -Continue insulin sliding scale CBG monitoring -Amaryl held  Obesity -Follow-up with PCP for lifestyle modifications on discharge  GERD -Continue PPI  Constipation -Question was patient has not been on a regular diet rate and solid food in several days. -Was able to have BM    Deconditioning -PT recommended home  health PT and OT  Consultants None  Procedures  Gastric emptying study  Discharge Exam: Vitals:   03/31/17 0434 03/31/17 1000  BP: (!) 153/97 (!) 157/98  Pulse: (!) 103 (!) 106  Resp: 20 20  Temp: 98.5 F (36.9 C) 98.1 F (36.7 C)   Patient denies further episodes of nausea or vomiting. Denies abdominal pain. Was able to eat some last night. Did have a bowel movement. Denies chest pain, shortness of breath, headache.   General: Well developed, well nourished, NAD  HEENT: NCAT,mucous membranes moist.  Cardiovascular: S1 S2 auscultated, RRR, no murmur  Respiratory: Clear to auscultation bilaterally with equal chest rise  Abdomen: Soft, obese, nontender, nondistended, + bowel sounds  Extremities: warm dry without cyanosis clubbing or edema   Neuro: AAOx3, nonfocal  Psych: Appropriate  Discharge Instructions Discharge Instructions    Discharge instructions    Complete by:  As directed    Patient will be discharged to home with home health physical and occupational therapy.  Patient will need to follow up with primary care provider within one week of discharge.  May consider gastroenterology follow up if symptoms persist. Patient should continue medications as prescribed.  Patient should follow a heart healthy/carb modified diet.     Current Discharge Medication List    START taking these medications   Details  metoCLOPramide (REGLAN) 10 MG tablet Take 1 tablet (10 mg total) by mouth every 8 (eight) hours as needed for nausea. Qty: 120 tablet, Refills: 1    ondansetron (ZOFRAN) 4 MG tablet Take 1 tablet (4 mg total) by mouth every 6 (six) hours as needed for nausea. Qty: 20 tablet, Refills: 0    pantoprazole (PROTONIX) 40 MG tablet Take 1 tablet (40 mg total) by mouth 2 (two) times daily. Qty: 30 tablet, Refills: 0      CONTINUE these medications which have CHANGED   Details  amLODipine (NORVASC) 10 MG tablet Take 1  tablet (10 mg total) by mouth daily. Qty: 30 tablet, Refills: 0    metoprolol tartrate (LOPRESSOR) 25 MG tablet Take 1 tablet (25 mg total) by mouth 2 (two) times daily. Qty: 60 tablet, Refills: 0      CONTINUE these medications which have NOT CHANGED   Details  Cholecalciferol (VITAMIN D3) 50000 units CAPS Take 50,000 Units by mouth every Wednesday.     Dextromethorphan-Guaifenesin (ROBITUSSIN SUGAR FREE) 10-100 MG/5ML liquid Take 5 mLs by mouth every 12 (twelve) hours as needed (for cough/congestion).    fluticasone (FLONASE) 50 MCG/ACT nasal spray Place 1 spray into both nostrils daily as needed for allergies or rhinitis.    glimepiride (AMARYL) 4 MG tablet Take 1 tablet (4 mg total) by mouth every morning. Qty: 30 tablet, Refills: 1    HYDROcodone-acetaminophen (NORCO/VICODIN) 5-325 MG tablet Take 1-2 tablets by mouth every 6 (six) hours as needed for moderate pain or severe pain. Qty: 15 tablet, Refills: 0    levalbuterol (XOPENEX) 0.63 MG/3ML nebulizer solution Take 3 mLs (0.63 mg total) by nebulization every 6 (six) hours as needed for wheezing or shortness of breath. Qty: 250 mL, Refills: 1    methocarbamol (ROBAXIN) 500 MG tablet Take 500 mg by mouth every 8 (eight) hours as needed for muscle spasms.    Olopatadine HCl (PAZEO) 0.7 % SOLN Place 1 drop into both eyes daily as needed (for irritation).       Allergies  Allergen Reactions  . Shellfish Allergy Anaphylaxis    Angioedema also  . Hydralazine Other (  See Comments)    Patient "zoned out" and caused extreme lethargy (??)   Follow-up Information    Kirstie PeriShah, Ashish, MD. Schedule an appointment as soon as possible for a visit in 1 week(s).   Specialty:  Internal Medicine Why:  Hospital follow up Contact information: [address] [place] [id number] [phone]            The results of significant diagnostics from this hospitalization (including imaging, microbiology, ancillary and laboratory) are listed  below for reference.    Significant Diagnostic Studies: Ct Abdomen Pelvis Wo Contrast  Result Date: 03/26/2017 CLINICAL DATA:  Intractable nausea and vomiting. EXAM: CT ABDOMEN AND PELVIS WITHOUT CONTRAST TECHNIQUE: Multidetector CT imaging of the abdomen and pelvis was performed following the standard protocol without IV contrast. COMPARISON:  CT 03/06/2017 FINDINGS: Lower chest: Mild bibasilar atelectasis.  Trace pericardial fluid. Hepatobiliary: No focal hepatic lesion allowing for lack contrast. Gallbladder physiologically distended, no calcified stone. No biliary dilatation. Pancreas: No ductal dilatation or inflammation. Spleen: Normal in size without focal abnormality. Adrenals/Urinary Tract: No adrenal nodule. No hydronephrosis or perinephric edema. No urolithiasis. Urinary bladder is physiologically distended without wall thickening. Stomach/Bowel: Stomach physiologically distended. No bowel inflammation, wall thickening or obstruction. Normal appendix. Small stool burden. Vascular/Lymphatic: Aorto bi-iliac atherosclerosis, age advanced. Abdominal or pelvic adenopathy. Reproductive: Intrauterine device appropriately positioned in the uterus. Low-density lesion in the cervix likely nabothian cyst, unchanged. No adnexal mass. Other: No free air, free fluid, or intra-abdominal fluid collection. Small fat containing umbilical hernia. Musculoskeletal: Stable from prior exam. Mild degenerative change in the spine. Sclerosis about the iliac aspect of both sacroiliac joints, left greater than right. IMPRESSION: 1. No acute abnormality or explanation for nausea and vomiting. 2. Aortic and branch atherosclerosis, advanced for age. Aortic Atherosclerosis (ICD10-I70.0). Electronically Signed   By: Rubye OaksMelanie  Ehinger M.D.   On: 03/26/2017 05:36   Dg Chest 2 View  Result Date: 03/11/2017 CLINICAL DATA:  Acute shortness of breath. EXAM: CHEST  2 VIEW COMPARISON:  None. FINDINGS: This is a low volume film. Upper  limits normal heart size noted. Bibasilar opacities are identified, favor atelectasis over airspace disease. There is no evidence of pneumothorax or pleural effusion. No bony abnormalities are present. IMPRESSION: Low volume film with bibasilar opacities -favor atelectasis. Upper limits normal heart size. Electronically Signed   By: Harmon PierJeffrey  Hu M.D.   On: 03/11/2017 15:50   Ct Chest Wo Contrast  Result Date: 03/11/2017 CLINICAL DATA:  Dyspnea and shortness breath intermittently for the last few days. EXAM: CT CHEST WITHOUT CONTRAST TECHNIQUE: Multidetector CT imaging of the chest was performed following the standard protocol without IV contrast. COMPARISON:  03/11/2017 FINDINGS: Body habitus reduces diagnostic sensitivity and specificity. Cardiovascular: Mild cardiomegaly. Mild atherosclerotic calcification in the left anterior descending coronary artery. Trace pericardial effusion. Mediastinum/Nodes: Poor definition of fat planes around the trachea and mainstem bronchi of uncertain significance. No overt pathologic paratracheal or AP window adenopathy. Fullness of both hila, difficult to exclude hilar or infrahilar adenopathy. Lungs/Pleura: Abnormal nodularity in the lung bases. For example, there is a 1.9 by 1.2 cm nodule along the right lower lobe side of the right major fissure on image 62/4. A subpleural right lower lobe nodule measuring 0.6 by 0.6 cm is present on image 66/4. If anteriorly in the left lower lobe, a 1.5 by 1.3 cm nodular density on image 65/4 is obscured by surrounding bandlike atelectasis. Similarly there is additional nodularity scattered along the left inferior pulmonary ligament and in the  subpleural locations of the left lower lobe. This process seems to favor the lower lobes. Low lung volumes are present and there is mosaic attenuation in the lower lobes. With regard to the airways, I do not see peribronchovascular nodularity along the more peripheral airways. Upper Abdomen:  Unremarkable Musculoskeletal: Unremarkable IMPRESSION: 1. Abnormal lung nodules in the lung bases with scattered atelectasis. Surrounding ground-glass opacities and mosaic attenuation also favoring the lung bases. Possibilities may include sarcoidosis, fungal disease, or atypical infectious process. Some of these nodules are along pleural surfaces, but I do not see definite paratracheal adenopathy or distal peribronchovascular nodularity to further suggest sarcoidosis. There is potentially infrahilar adenopathy bilaterally, although this is difficult to separate out from the vessels given the lack of IV contrast. Follow up imaging to ensure resolution is recommended to exclude the unlikely possibility of malignancy. 2. Athero left anterior descending coronary atherosclerosis and mild cardiomegaly. 3. Small pericardial effusion. 4. Low lung volumes. Electronically Signed   By: Gaylyn Rong M.D.   On: 03/11/2017 19:29   Nm Gastric Emptying  Result Date: 03/30/2017 CLINICAL DATA:  Gastroparesis. EXAM: NUCLEAR MEDICINE GASTRIC EMPTYING SCAN TECHNIQUE: After oral ingestion of radiolabeled meal, sequential abdominal images were obtained for 4 hours. Percentage of activity emptying the stomach was calculated at 1 hour, 2 hour, 3 hour, and 4 hours. RADIOPHARMACEUTICALS:  2.0 mCi Tc-31m sulfur colloid in standardized meal COMPARISON:  None. FINDINGS: Expected location of the stomach in the left upper quadrant. Ingested meal empties the stomach gradually over the course of the study. 8% emptied at 1 hr ( normal >= 10%) 1% emptied at 2 hr ( normal >= 40%) 10% emptied at 3 hr ( normal >= 70%) 26% emptied at 4 hr ( normal >= 90%) IMPRESSION: Delayed gastric emptying study.  Only 26% emptying at 4 hours. Electronically Signed   By: Francene Boyers M.D.   On: 03/30/2017 12:58   US Renal  Result Date: 03/11/2017 CLINICAL DATA:  Acute renal failure. EXAM: RENAL / URINARY TRACT ULTRASOUND COMPLETE COMPARISON:  None.  FINDINGS: Right Kidney: Length: 9 cm. Increased renal cortical echogenicity. No hydronephrosis visualized. No visualized mass. Left Kidney: Length: 9.5 cm. Increased renal cortical echogenicity. No hydronephrosis visualized. No visualized mass. Bladder: Appears normal for degree of bladder distention. IMPRESSION: Increased renal echogenicity consistent with chronic medical renal disease. Electronically Signed   By: Rubye Oaks M.D.   On: 03/11/2017 20:07   Dg Chest Port 1 View  Result Date: 03/26/2017 CLINICAL DATA:  Dyspnea and vomiting x1 day EXAM: PORTABLE CHEST 1 VIEW COMPARISON:  Chest CT 03/11/2017, CXR 03/11/2017 FINDINGS: The heart size and mediastinal contours are within normal limits. Low lung volumes are present. Left basilar atelectasis noted. The visualized skeletal structures are unremarkable. IMPRESSION: No active disease. Left lower lobe atelectasis with low lung volumes. Electronically Signed   By: Tollie Eth M.D.   On: 03/26/2017 02:08   Dg Abd Portable 1v  Result Date: 03/28/2017 CLINICAL DATA:  46 year old female with abdominal pain and distention for the past 2-3 weeks EXAM: PORTABLE ABDOMEN - 1 VIEW COMPARISON:  Reason prior CT abdomen/ pelvis 03/26/2017 FINDINGS: The bowel gas pattern is not obstructed. There is a moderate volume of gas within the colon. An IUD projects over the anatomic pelvis in the expected location. No evidence of organomegaly or abnormal calcification. Osseous structures are unremarkable. IMPRESSION: Negative. Electronically Signed   By: Malachy Moan M.D.   On: 03/28/2017 13:26   Dg Foot Complete Right  Result Date: 03/13/2017 CLINICAL DATA:  Right foot pain with swelling. EXAM: RIGHT FOOT COMPLETE - 3+ VIEW COMPARISON:  None. FINDINGS: No fracture. No subluxation or dislocation. Hallux valgus deformity noted. IMPRESSION: Negative. Electronically Signed   By: Kennith CenterEric  Mansell M.D.   On: 03/13/2017 12:08    Microbiology: No results found for this or  any previous visit (from the past 240 hour(s)).   Labs: Basic Metabolic Panel:  Recent Labs Lab 03/27/17 0257 03/28/17 0525 03/29/17 0437 03/30/17 0615 03/31/17 0459  NA 146* 143 141 143 140  K 3.9 3.9 3.6 3.9 3.8  CL 114* 114* 114* 116* 115*  CO2 23 19* 19* 22 19*  GLUCOSE 105* 97 123* 111* 115*  BUN 27* 22* 24* 24* 22*  CREATININE 3.25* 3.05* 3.06* 3.01* 3.01*  CALCIUM 9.3 8.9 8.7* 8.4* 8.5*   Liver Function Tests:  Recent Labs Lab 03/25/17 2329  AST 18  ALT 19  ALKPHOS 37*  BILITOT 0.8  PROT 7.7  ALBUMIN 3.6    Recent Labs Lab 03/25/17 2329  LIPASE 43   No results for input(s): AMMONIA in the last 168 hours. CBC:  Recent Labs Lab 03/25/17 2329 03/31/17 0459  WBC 11.9* 7.7  HGB 11.2* 9.3*  HCT 33.7* 28.9*  MCV 87.8 85.5  PLT 564* 192   Cardiac Enzymes: No results for input(s): CKTOTAL, CKMB, CKMBINDEX, TROPONINI in the last 168 hours. BNP: BNP (last 3 results)  Recent Labs  03/11/17 1425  BNP 135.6*    ProBNP (last 3 results) No results for input(s): PROBNP in the last 8760 hours.  CBG:  Recent Labs Lab 03/30/17 1309 03/30/17 1650 03/30/17 2139 03/31/17 0725 03/31/17 1213  GLUCAP 100* 160* 137* 100* 113*       Signed:  Tison Leibold  [facility] 03/31/2017, 4:10 PM

## 2017-05-09 ENCOUNTER — Inpatient Hospital Stay (HOSPITAL_COMMUNITY)
Admission: EM | Admit: 2017-05-09 | Discharge: 2017-05-13 | DRG: 074 | Disposition: A | Discharge status: Home Health | Payer: Medicare Other | Attending: Internal Medicine | Admitting: Internal Medicine

## 2017-05-09 ENCOUNTER — Encounter (HOSPITAL_COMMUNITY): Payer: Self-pay

## 2017-05-09 DIAGNOSIS — E440 Moderate protein-calorie malnutrition: Secondary | ICD-10-CM | POA: Diagnosis present

## 2017-05-09 DIAGNOSIS — N184 Chronic kidney disease, stage 4 (severe): Secondary | ICD-10-CM | POA: Diagnosis present

## 2017-05-09 DIAGNOSIS — D638 Anemia in other chronic diseases classified elsewhere: Secondary | ICD-10-CM | POA: Diagnosis present

## 2017-05-09 DIAGNOSIS — E669 Obesity, unspecified: Secondary | ICD-10-CM | POA: Diagnosis present

## 2017-05-09 DIAGNOSIS — Z91013 Allergy to seafood: Secondary | ICD-10-CM

## 2017-05-09 DIAGNOSIS — E869 Volume depletion, unspecified: Secondary | ICD-10-CM

## 2017-05-09 DIAGNOSIS — E119 Type 2 diabetes mellitus without complications: Secondary | ICD-10-CM

## 2017-05-09 DIAGNOSIS — N179 Acute kidney failure, unspecified: Secondary | ICD-10-CM | POA: Diagnosis not present

## 2017-05-09 DIAGNOSIS — R109 Unspecified abdominal pain: Secondary | ICD-10-CM | POA: Diagnosis not present

## 2017-05-09 DIAGNOSIS — Z6825 Body mass index (BMI) 25.0-25.9, adult: Secondary | ICD-10-CM

## 2017-05-09 DIAGNOSIS — N189 Chronic kidney disease, unspecified: Secondary | ICD-10-CM

## 2017-05-09 DIAGNOSIS — Z79899 Other long term (current) drug therapy: Secondary | ICD-10-CM

## 2017-05-09 DIAGNOSIS — E1143 Type 2 diabetes mellitus with diabetic autonomic (poly)neuropathy: Secondary | ICD-10-CM | POA: Diagnosis not present

## 2017-05-09 DIAGNOSIS — K219 Gastro-esophageal reflux disease without esophagitis: Secondary | ICD-10-CM | POA: Diagnosis not present

## 2017-05-09 DIAGNOSIS — K3184 Gastroparesis: Secondary | ICD-10-CM | POA: Diagnosis not present

## 2017-05-09 DIAGNOSIS — Z888 Allergy status to other drugs, medicaments and biological substances status: Secondary | ICD-10-CM

## 2017-05-09 DIAGNOSIS — I10 Essential (primary) hypertension: Secondary | ICD-10-CM

## 2017-05-09 DIAGNOSIS — I129 Hypertensive chronic kidney disease with stage 1 through stage 4 chronic kidney disease, or unspecified chronic kidney disease: Secondary | ICD-10-CM | POA: Diagnosis present

## 2017-05-09 DIAGNOSIS — E870 Hyperosmolality and hypernatremia: Secondary | ICD-10-CM | POA: Diagnosis present

## 2017-05-09 DIAGNOSIS — R112 Nausea with vomiting, unspecified: Secondary | ICD-10-CM | POA: Diagnosis not present

## 2017-05-09 DIAGNOSIS — E860 Dehydration: Secondary | ICD-10-CM | POA: Diagnosis present

## 2017-05-09 DIAGNOSIS — E1122 Type 2 diabetes mellitus with diabetic chronic kidney disease: Secondary | ICD-10-CM

## 2017-05-09 DIAGNOSIS — R1110 Vomiting, unspecified: Secondary | ICD-10-CM

## 2017-05-09 LAB — COMPREHENSIVE METABOLIC PANEL
ALK PHOS: 25 U/L — AB (ref 38–126)
ALT: 26 U/L (ref 14–54)
AST: 30 U/L (ref 15–41)
Albumin: 3.1 g/dL — ABNORMAL LOW (ref 3.5–5.0)
Anion gap: 12 (ref 5–15)
BUN: 17 mg/dL (ref 6–20)
CALCIUM: 10.4 mg/dL — AB (ref 8.9–10.3)
CO2: 31 mmol/L (ref 22–32)
CREATININE: 4.06 mg/dL — AB (ref 0.44–1.00)
Chloride: 105 mmol/L (ref 101–111)
GFR calc Af Amer: 14 mL/min — ABNORMAL LOW (ref >60)
GFR, EST NON AFRICAN AMERICAN: 12 mL/min — AB (ref >60)
Glucose, Bld: 112 mg/dL — ABNORMAL HIGH (ref 65–99)
Potassium: 3.9 mmol/L (ref 3.5–5.1)
Sodium: 148 mmol/L — ABNORMAL HIGH (ref 135–145)
TOTAL PROTEIN: 6.1 g/dL — AB (ref 6.5–8.1)
Total Bilirubin: 0.4 mg/dL (ref 0.3–1.2)

## 2017-05-09 LAB — CBC
HCT: 31.6 % — ABNORMAL LOW (ref 36.0–46.0)
Hemoglobin: 10.1 g/dL — ABNORMAL LOW (ref 12.0–15.0)
MCH: 28.1 pg (ref 26.0–34.0)
MCHC: 32 g/dL (ref 30.0–36.0)
MCV: 87.8 fL (ref 78.0–100.0)
PLATELETS: 347 10*3/uL (ref 150–400)
RBC: 3.6 MIL/uL — ABNORMAL LOW (ref 3.87–5.11)
RDW: 14.3 % (ref 11.5–15.5)
WBC: 5.4 10*3/uL (ref 4.0–10.5)

## 2017-05-09 LAB — LIPASE, BLOOD: Lipase: 45 U/L (ref 11–51)

## 2017-05-09 MED ORDER — ONDANSETRON HCL 4 MG PO TABS
4.0000 mg | ORAL_TABLET | Freq: Four times a day (QID) | ORAL | Status: DC | PRN
  Administered 2017-05-10 (×2): 4 mg via ORAL
  Filled 2017-05-09 (×2): qty 1

## 2017-05-09 MED ORDER — ACETAMINOPHEN 650 MG RE SUPP: 650.0000 mg | Freq: Four times a day (QID) | RECTAL | Status: DC | PRN

## 2017-05-09 MED ORDER — IOPAMIDOL (ISOVUE-300) INJECTION 61%
INTRAVENOUS | Status: AC
  Filled 2017-05-09: qty 30

## 2017-05-09 MED ORDER — VITAMIN D (ERGOCALCIFEROL) 1.25 MG (50000 UNIT) PO CAPS
50000.0000 [IU] | ORAL_CAPSULE | ORAL | Status: DC
  Administered 2017-05-11: 50000 [IU] via ORAL
  Filled 2017-05-09: qty 1

## 2017-05-09 MED ORDER — GUAIFENESIN-DM 100-10 MG/5ML PO SYRP: 5.0000 mL | ORAL_SOLUTION | Freq: Two times a day (BID) | ORAL | Status: DC | PRN

## 2017-05-09 MED ORDER — SODIUM CHLORIDE 0.9 % IV SOLN
INTRAVENOUS | Status: DC
  Administered 2017-05-09 – 2017-05-10 (×2): via INTRAVENOUS

## 2017-05-09 MED ORDER — METOPROLOL TARTRATE 25 MG PO TABS
25.0000 mg | ORAL_TABLET | Freq: Two times a day (BID) | ORAL | Status: DC
  Administered 2017-05-09 – 2017-05-13 (×8): 25 mg via ORAL
  Filled 2017-05-09 (×8): qty 1

## 2017-05-09 MED ORDER — TRAMADOL HCL 50 MG PO TABS
50.0000 mg | ORAL_TABLET | Freq: Four times a day (QID) | ORAL | Status: DC | PRN
  Administered 2017-05-09 – 2017-05-10 (×2): 50 mg via ORAL
  Filled 2017-05-09 (×2): qty 1

## 2017-05-09 MED ORDER — PANTOPRAZOLE SODIUM 40 MG PO TBEC
40.0000 mg | DELAYED_RELEASE_TABLET | Freq: Two times a day (BID) | ORAL | Status: DC
  Administered 2017-05-09 – 2017-05-13 (×8): 40 mg via ORAL
  Filled 2017-05-09 (×8): qty 1

## 2017-05-09 MED ORDER — LEVALBUTEROL HCL 0.63 MG/3ML IN NEBU: 0.6300 mg | INHALATION_SOLUTION | Freq: Four times a day (QID) | RESPIRATORY_TRACT | Status: DC | PRN

## 2017-05-09 MED ORDER — OLOPATADINE HCL 0.1 % OP SOLN
1.0000 [drp] | Freq: Every day | OPHTHALMIC | Status: DC | PRN
  Filled 2017-05-09: qty 5

## 2017-05-09 MED ORDER — FLUTICASONE PROPIONATE 50 MCG/ACT NA SUSP
1.0000 | Freq: Every day | NASAL | Status: DC | PRN
  Filled 2017-05-09: qty 16

## 2017-05-09 MED ORDER — BOOST / RESOURCE BREEZE PO LIQD
1.0000 | Freq: Three times a day (TID) | ORAL | Status: DC
  Administered 2017-05-10 – 2017-05-12 (×6): 1 via ORAL

## 2017-05-09 MED ORDER — SODIUM CHLORIDE 0.9 % IV BOLUS (SEPSIS)
1000.0000 mL | Freq: Once | INTRAVENOUS | Status: AC
  Administered 2017-05-09: 1000 mL via INTRAVENOUS

## 2017-05-09 MED ORDER — ONDANSETRON HCL 4 MG/2ML IJ SOLN
4.0000 mg | Freq: Four times a day (QID) | INTRAMUSCULAR | Status: DC | PRN
  Administered 2017-05-12 (×2): 4 mg via INTRAVENOUS
  Filled 2017-05-09 (×3): qty 2

## 2017-05-09 MED ORDER — ACETAMINOPHEN 325 MG PO TABS
650.0000 mg | ORAL_TABLET | Freq: Four times a day (QID) | ORAL | Status: DC | PRN
  Filled 2017-05-09: qty 2

## 2017-05-09 MED ORDER — METOCLOPRAMIDE HCL 5 MG/ML IJ SOLN
10.0000 mg | Freq: Three times a day (TID) | INTRAMUSCULAR | Status: DC
  Administered 2017-05-09 – 2017-05-11 (×7): 10 mg via INTRAVENOUS
  Filled 2017-05-09 (×7): qty 2

## 2017-05-09 MED ORDER — INSULIN ASPART 100 UNIT/ML ~~LOC~~ SOLN
0.0000 [IU] | Freq: Three times a day (TID) | SUBCUTANEOUS | Status: DC
  Administered 2017-05-12: 1 [IU] via SUBCUTANEOUS

## 2017-05-09 MED ORDER — AMLODIPINE BESYLATE 10 MG PO TABS
10.0000 mg | ORAL_TABLET | Freq: Every day | ORAL | Status: DC
  Administered 2017-05-09 – 2017-05-13 (×5): 10 mg via ORAL
  Filled 2017-05-09 (×6): qty 1

## 2017-05-09 MED ORDER — ONDANSETRON HCL 4 MG/2ML IJ SOLN
4.0000 mg | Freq: Once | INTRAMUSCULAR | Status: AC
  Administered 2017-05-09: 4 mg via INTRAVENOUS
  Filled 2017-05-09: qty 2

## 2017-05-09 MED ORDER — HEPARIN SODIUM (PORCINE) 5000 UNIT/ML IJ SOLN
5000.0000 [IU] | Freq: Three times a day (TID) | INTRAMUSCULAR | Status: DC
  Administered 2017-05-09 – 2017-05-13 (×11): 5000 [IU] via SUBCUTANEOUS
  Filled 2017-05-09 (×10): qty 1

## 2017-05-09 NOTE — ED Notes (Signed)
Pt ambulatory to restroom but unable to provide urine sample at this time.

## 2017-05-09 NOTE — ED Triage Notes (Signed)
Pt reports intractable vomiting and abdominal pain since discharge for same. Pt reports she hasn't been able to keep anything down since discharge. Denies diarrhea, fever, chills.

## 2017-05-09 NOTE — H&P (Signed)
History and Physical    Pamela Goodwin ZDG:[id number] DOB: [birth year] DOA: 05/09/2017  PCP: [name], MD   Patient coming from: Home  Chief Complaint: Nausea and Vomiting ; Abdominal pain Saturday and poor po intake  HPI: Pamela Goodwin is a 46 y.o. female with medical history significant of HTN, Diabetes Mellitus Type 2 with Gastroparesis, Hx of Pancreatitis, Renal Disease currently being worked up with Nephrology as an outpatient and other comorbids who presented to [facility] ED with a cc of Nausea and Vomiting that started this AM. She had a similar hospitalization back in July of this year. Patient is a poor historian and likely has some developmental delay so history was gathered from the patient as well as the sister. The patient's sister states the patient has had poor po intake recently and on Saturday she started having Abdominal Pain. Abdominal pain spontaneously resolved and last night the patient had tacos for dinner and this AM she developed bilious N/V. Patient states that she was "throwing up acid." Patient states she had diarrhea but then when asked in detail wasn't true diarrhea and just a normal bowel movement per sister. Patient is unable to keep any foods or liquids down. Sister also states patient has been very fatigued and has been laying in bed weak from not eating properly. Patient's only other complaint is being cold. Patient Denied any current abdominal pain and denied dysuria, CP or SOB. [facility] was called to admit for Intractable N/V with Dehydration and an inability to keep anything po down.  ED Course: Had basic blood work and was given IVF Bolus of NS and given antiemetics. Lipase was 45  Review of Systems: As per HPI otherwise 10 point review of systems negative.   Past Medical History:  Diagnosis Date  . Diabetes mellitus without complication (HCC)   . Hypertension    SURGICAL HISTORY History reviewed. No pertinent surgical history.  SOCIAL HISTORY  reports that  she has never smoked. She has never used smokeless tobacco. She reports that she does not drink alcohol or use drugs.  Allergies  Allergen Reactions  . Shellfish Allergy Anaphylaxis    Angioedema also  . Hydralazine Other (See Comments)    Patient "zoned out" and caused extreme lethargy (??)   Family History  Problem Relation Age of Onset  . Other Mother        Glioblastoma, deceased   Prior to Admission medications   Medication Sig Start Date End Date Taking? Authorizing Provider  amLODipine (NORVASC) 10 MG tablet Take 1 tablet (10 mg total) by mouth daily. 04/01/17  Yes Mikhail, Nita Sells, DO  Cholecalciferol (VITAMIN D3) 50000 units CAPS Take 50,000 Units by mouth every Wednesday.    Yes [provider]  Dextromethorphan-Guaifenesin (ROBITUSSIN SUGAR FREE) 10-100 MG/5ML liquid Take 5 mLs by mouth every 12 (twelve) hours as needed (for cough/congestion).   Yes [provider]  fluticasone (FLONASE) 50 MCG/ACT nasal spray Place 1 spray into both nostrils daily as needed for allergies or rhinitis.   Yes [provider]  glimepiride (AMARYL) 4 MG tablet Take 1 tablet (4 mg total) by mouth every morning. 03/15/17 03/15/18 Yes Dorothea Ogle, MD  HYDROcodone-acetaminophen (NORCO/VICODIN) 5-325 MG tablet Take 1-2 tablets by mouth every 6 (six) hours as needed for moderate pain or severe pain. 03/15/17  Yes Dorothea Ogle, MD  levalbuterol Pauline Aus) 0.63 MG/3ML nebulizer solution Take 3 mLs (0.63 mg total) by nebulization every 6 (six) hours as needed for wheezing or shortness  of breath. 03/15/17  Yes Dorothea OgleMyers, Iskra M, MD  methocarbamol (ROBAXIN) 500 MG tablet Take 500 mg by mouth every 8 (eight) hours as needed for muscle spasms.   Yes [provider]  metoCLOPramide (REGLAN) 10 MG tablet Take 1 tablet (10 mg total) by mouth every 8 (eight) hours as needed for nausea. 03/31/17 03/31/18 Yes Mikhail, Nita SellsMaryann, DO  metoprolol tartrate (LOPRESSOR) 25 MG tablet Take 1 tablet  (25 mg total) by mouth 2 (two) times daily. 03/31/17  Yes Mikhail, Nita SellsMaryann, DO  Olopatadine HCl (PAZEO) 0.7 % SOLN Place 1 drop into both eyes daily as needed (for irritation).   Yes [provider]  ondansetron (ZOFRAN) 4 MG tablet Take 1 tablet (4 mg total) by mouth every 6 (six) hours as needed for nausea. 03/31/17  Yes Mikhail, Maryann, DO  pantoprazole (PROTONIX) 40 MG tablet Take 1 tablet (40 mg total) by mouth 2 (two) times daily. 03/31/17  Yes Edsel PetrinMikhail, Maryann, DO   Physical Exam: Vitals:   05/09/17 1550 05/09/17 1645 05/09/17 1652 05/09/17 1859  BP: 126/86 120/89 120/89 (!) 149/103  Pulse: (!) 119 95 99 (!) 110  Resp: 18  15 16   Temp:    97.9 F (36.6 C)  TempSrc:    Oral  SpO2: 96% 96% 100% 97%  Weight:       Constitutional: WN/WD obese AAF NAD and appears calm and comfortable with what appears to have some developmental delay Eyes: Lids and conjunctivae normal, sclerae anicteric  ENMT: External Ears, Nose appear normal. Grossly normal hearing. Mucous membranes are slightly dry.  Neck: Appears normal, supple, no cervical masses, normal ROM, no appreciable thyromegaly, no JVD Respiratory: Diminished to auscultation bilaterally, no wheezing, rales, rhonchi or crackles. Normal respiratory effort and patient is not tachypenic. No accessory muscle use.  Cardiovascular: Slightly tachycardic, no murmurs / rubs / gallops. S1 and S2 auscultated. No extremity edema.  Abdomen: Soft, non-tender, non-distended. No masses palpated. No appreciable hepatosplenomegaly. Bowel sounds positive x4.  GU: Deferred. Musculoskeletal: No clubbing / cyanosis of digits/nails. No joint deformity upper and lower extremities. Skin: No rashes, lesions, ulcers on a limited skin eval. No induration; Warm and dry.  Neurologic: CN 2-12 grossly intact with no focal deficits. Strength 5/5 in all 4. Romberg sign cerebellar reflexes not assessed.  Psychiatric: Normal judgment and insight. Alert and oriented x 3.  Normal mood and appropriate affect.   Labs on Admission: I have personally reviewed following labs and imaging studies  CBC:  Recent Labs Lab 05/09/17 1346  WBC 5.4  HGB 10.1*  HCT 31.6*  MCV 87.8  PLT 347   Basic Metabolic Panel:  Recent Labs Lab 05/09/17 1346  NA 148*  K 3.9  CL 105  CO2 31  GLUCOSE 112*  BUN 17  CREATININE 4.06*  CALCIUM 10.4*   GFR: Estimated Creatinine Clearance: 15.4 mL/min (A) (by C-G formula based on SCr of 4.06 mg/dL (H)). Liver Function Tests:  Recent Labs Lab 05/09/17 1346  AST 30  ALT 26  ALKPHOS 25*  BILITOT 0.4  PROT 6.1*  ALBUMIN 3.1*    Recent Labs Lab 05/09/17 1346  LIPASE 45   No results for input(s): AMMONIA in the last 168 hours. Coagulation Profile: No results for input(s): INR, PROTIME in the last 168 hours. Cardiac Enzymes: No results for input(s): CKTOTAL, CKMB, CKMBINDEX, TROPONINI in the last 168 hours. BNP (last 3 results) No results for input(s): PROBNP in the last 8760 hours. HbA1C: No results for input(s): HGBA1C in  the last 72 hours. CBG: No results for input(s): GLUCAP in the last 168 hours. Lipid Profile: No results for input(s): CHOL, HDL, LDLCALC, TRIG, CHOLHDL, LDLDIRECT in the last 72 hours. Thyroid Function Tests: No results for input(s): TSH, T4TOTAL, FREET4, T3FREE, THYROIDAB in the last 72 hours. Anemia Panel: No results for input(s): VITAMINB12, FOLATE, FERRITIN, TIBC, IRON, RETICCTPCT in the last 72 hours. Urine analysis:    Component Value Date/Time   COLORURINE YELLOW 03/26/2017 0420   APPEARANCEUR CLEAR 03/26/2017 0420   LABSPEC 1.020 03/26/2017 0420   PHURINE 6.0 03/26/2017 0420   GLUCOSEU NEGATIVE 03/26/2017 0420   HGBUR MODERATE (A) 03/26/2017 0420   BILIRUBINUR NEGATIVE 03/26/2017 0420   KETONESUR 15 (A) 03/26/2017 0420   PROTEINUR >300 (A) 03/26/2017 0420   NITRITE NEGATIVE 03/26/2017 0420   LEUKOCYTESUR NEGATIVE 03/26/2017 0420   Sepsis Labs:  !!!!!!!!!!!!!!!!!!!!!!!!!!!!!!!!!!!!!!!!!!!! @LABRCNTIP (procalcitonin:4,lacticidven:4) )No results found for this or any previous visit (from the past 240 hour(s)).  Radiological Exams on Admission: No results found.  EKG: Independently reviewed. Showed NSR at a rate of 96. No ST Elevation on my interpretation.   Assessment/Plan Active Problems:   Acute renal failure (ARF) (HCC)   Intractable nausea and vomiting   Acute kidney injury superimposed on chronic kidney disease (HCC)   Abdominal pain   Hypercalcemia   GERD (gastroesophageal reflux disease)   Anemia of chronic disease   Controlled diabetes mellitus (HCC)   Hypernatremia   Diabetic gastroparesis (HCC)   Essential hypertension  Intractable Nausea and Vomiting in the setting of Diabetic Gastroparesis -Place in Obs Tele -Obtain CT Abd/Pelvis w/o Contrast due to Kidney Fxn -Lipase Normal  -Differential includes Known Gastroparesis given history of diabetes vs. Nausea from Uremia -Antiemetics with Zofran 4 mg po/IV N/V  -Bolused 1 Liter of NS in ED; C/w IV fluids NS at 100 ml/hr -Scheduled IV Reglan 10 mg q8h -Recent Gastric Emptying Study showed Delayed Emptying  -Start with Clear Liquid Diet and Advance diet as tolerated -Dietician Consulted for Evaluation and Recommendations   Recent Intermittent Epigastric Abdominal Pain -Lipase 45   -Check CT Abd/Pelvis given patient's previous history of pancreatitis question possibility of pseudocyst or some obstructive abnormality. -Had Recent Gastric Emptying Study   AKI on CKD Stage IV -Likely in the setting of poor po Intake and N/V -U/A Still pending  -BUN/Cr elevated at 17/4.06; Last Value was 22/3.01  -Given IVF Bolus of 1 Liter; Continue IVF at 100 mL/hr -Avoid Nephrotoxic Agents -May need to Check Renal U/S -Bladder scan every shift monitoring for urinary retention -Seeing Nephrology as an Outpatient; If continues to worsen may consider getting Nephrology  Consultation -Check CMP in AM   Hypernatremia -Na+ was 148 -Likely from Dehydration -Continue with IVF Rehydration with 100 mL/hr -Repeat CMP in AM  Hypercalcemia  -Patient's initial calcium level elevated at 10.4.  -Check Ionized Calcium in AM and C/w IVF Rehydration' -Repeat CMP in AM   Diabetes Mellitus Type 2  -Last HbA1c was 6.4 -Hold Amaryl and likley need to D/C at Discharge given risk of Hypoglycemia -Placed on Sensitive Novolog SSI -Continue to Monitor CBG's  Hypertension -C/w Metoprolol 25 mg po BID and Amlodipine 10 mg po Daily  Generalized Weakness -Likely from [facility] and poor po Intake -PT/OT Eval  GERD -C/w Pantoprazole 40 mg po BID  Normocytic Anemia likely of Chronic Disease -Hb/Hct was 10.1/31.6 -Likely in the setting of Renal Disease -Continue to Monitor and evaluate for S/Sx of Bleeding -Repeat CBC in AM  DVT prophylaxis:  Heparin 5,000 units sq q8h Code Status: FULL CODE Family Communication: Discussed with the Sister at bedside Disposition Plan: Pending PT Eval but likely Home at D/C Consults called: None Admission status: Obs Tele  Severity of Illness: The appropriate patient status for this patient is OBSERVATION. Observation status is judged to be reasonable and necessary in order to provide the required intensity of service to ensure the patient's safety. The patient's presenting symptoms, physical exam findings, and initial radiographic and laboratory data in the context of their medical condition is felt to place them at decreased risk for further clinical deterioration. Furthermore, it is anticipated that the patient will be medically stable for discharge from the hospital within 2 midnights of admission. The following factors support the patient status of observation.   " The patient's presenting symptoms include Nausea and Vomiting. " The physical exam findings include Dry Mucous Membranes. " The initial laboratory data shows  Dehydration.  Merlene Laughter, D.O. [facility] Pager [phone]  If 7PM-7AM, please contact night-coverage [email or web address] Password [email or web address]  05/09/2017, 7:08 PM

## 2017-05-09 NOTE — ED Notes (Signed)
Pt provided with cup for urine sample. Pt not able to urinate at this time.

## 2017-05-09 NOTE — ED Provider Notes (Signed)
[facility]-EMERGENCY DEPT Provider Note   CSN: [id number] Arrival date & time: 05/09/17  1334     History   Chief Complaint Chief Complaint  Patient presents with  . Emesis  . Abdominal Pain    HPI Pamela Goodwin is a 46 y.o. female.  HPI   46 year old female with a history of diabetes and hypertension as well as recently diagnosed gastroparesis presents today with worsened nausea and vomiting over the past several days. Patient lives with her father who appears to be a better historian than her. They report that she has been accepted to eat as good recovery and has tried to eat for the past several days. She is not currently complaining of any pain. She denies any fever or chills. She states that she has been having normal bowel movements. Her father states that she has been collecting her urine for the past 3 days scheduled to bring this to the nephrologist. Do not report any change in urine output, dysuria, or hematuria.  Past Medical History:  Diagnosis Date  . Diabetes mellitus without complication (HCC)   . Hypertension     Patient Active Problem List   Diagnosis Date Noted  . Intractable nausea and vomiting 03/26/2017  . Acute kidney injury superimposed on chronic kidney disease (HCC) 03/26/2017  . Abdominal pain 03/26/2017  . Hypertensive urgency 03/26/2017  . Leukocytosis 03/26/2017  . Hypercalcemia 03/26/2017  . Acute renal failure (ARF) (HCC) 03/11/2017    History reviewed. No pertinent surgical history.  OB History    No data available       Home Medications    Prior to Admission medications   Medication Sig Start Date End Date Taking? Authorizing Provider  amLODipine (NORVASC) 10 MG tablet Take 1 tablet (10 mg total) by mouth daily. 04/01/17   Edsel PetrinMikhail, Maryann, DO  Cholecalciferol (VITAMIN D3) 50000 units CAPS Take 50,000 Units by mouth every Wednesday.     [provider]  Dextromethorphan-Guaifenesin (ROBITUSSIN SUGAR FREE) 10-100 MG/5ML liquid Take  5 mLs by mouth every 12 (twelve) hours as needed (for cough/congestion).    [provider]  fluticasone (FLONASE) 50 MCG/ACT nasal spray Place 1 spray into both nostrils daily as needed for allergies or rhinitis.    [provider]  glimepiride (AMARYL) 4 MG tablet Take 1 tablet (4 mg total) by mouth every morning. 03/15/17 03/15/18  Dorothea OgleMyers, Iskra M, MD  HYDROcodone-acetaminophen (NORCO/VICODIN) 5-325 MG tablet Take 1-2 tablets by mouth every 6 (six) hours as needed for moderate pain or severe pain. 03/15/17   Dorothea OgleMyers, Iskra M, MD  levalbuterol Pauline Aus(XOPENEX) 0.63 MG/3ML nebulizer solution Take 3 mLs (0.63 mg total) by nebulization every 6 (six) hours as needed for wheezing or shortness of breath. 03/15/17   Dorothea OgleMyers, Iskra M, MD  methocarbamol (ROBAXIN) 500 MG tablet Take 500 mg by mouth every 8 (eight) hours as needed for muscle spasms.    [provider]  metoCLOPramide (REGLAN) 10 MG tablet Take 1 tablet (10 mg total) by mouth every 8 (eight) hours as needed for nausea. 03/31/17 03/31/18  Edsel PetrinMikhail, Maryann, DO  metoprolol tartrate (LOPRESSOR) 25 MG tablet Take 1 tablet (25 mg total) by mouth 2 (two) times daily. 03/31/17   Mikhail, Nita SellsMaryann, DO  Olopatadine HCl (PAZEO) 0.7 % SOLN Place 1 drop into both eyes daily as needed (for irritation).    [provider]  ondansetron (ZOFRAN) 4 MG tablet Take 1 tablet (4 mg total) by mouth every 6 (six) hours as needed for nausea.  03/31/17   Mikhail, Nita Sells, DO  pantoprazole (PROTONIX) 40 MG tablet Take 1 tablet (40 mg total) by mouth 2 (two) times daily. 03/31/17   Edsel Petrin, DO    Family History Family History  Problem Relation Age of Onset  . Other Mother        Glioblastoma, deceased    Social History Social History  Substance Use Topics  . Smoking status: Never Smoker  . Smokeless tobacco: Never Used  . Alcohol use No     Allergies   Shellfish allergy and Hydralazine   Review of Systems Review of Systems  All  other systems reviewed and are negative.    Physical Exam Updated Vital Signs BP 126/86   Pulse (!) 119   Temp 98.3 F (36.8 C) (Oral)   Resp 18   Wt 71.2 kg (157 lb)   SpO2 96%   BMI 30.66 kg/m   Physical Exam  Constitutional: She is oriented to person, place, and time. She appears well-developed and well-nourished. No distress.  HENT:  Head: Normocephalic and atraumatic.  Right Ear: External ear normal.  Left Ear: External ear normal.  Membranes are somewhat dry  Eyes: Pupils are equal, round, and reactive to light. Conjunctivae and EOM are normal.  Neck: Normal range of motion.  Cardiovascular: Normal rate and regular rhythm.   Pulmonary/Chest: Effort normal and breath sounds normal.  Abdominal: Soft. Bowel sounds are normal.  Musculoskeletal: Normal range of motion. She exhibits no edema.  Neurological: She is alert and oriented to person, place, and time. No cranial nerve deficit. Coordination normal.  Skin: Skin is warm and dry. Capillary refill takes less than 2 seconds.  Psychiatric: She has a normal mood and affect. Her behavior is normal.  Nursing note and vitals reviewed.    ED Treatments / Results  Labs (all labs ordered are listed, but only abnormal results are displayed) Labs Reviewed  COMPREHENSIVE METABOLIC PANEL - Abnormal; Notable for the following:       Result Value   Sodium 148 (*)    Glucose, Bld 112 (*)    Creatinine, Ser 4.06 (*)    Calcium 10.4 (*)    Total Protein 6.1 (*)    Albumin 3.1 (*)    Alkaline Phosphatase 25 (*)    GFR calc non Af Amer 12 (*)    GFR calc Af Amer 14 (*)    All other components within normal limits  CBC - Abnormal; Notable for the following:    RBC 3.60 (*)    Hemoglobin 10.1 (*)    HCT 31.6 (*)    All other components within normal limits  LIPASE, BLOOD  URINALYSIS, ROUTINE W REFLEX MICROSCOPIC    EKG  EKG Interpretation  Date/Time:  Monday May 09 2017 16:51:52 EDT Ventricular Rate:  96 PR  Interval:    QRS Duration: 80 QT Interval:  341 QTC Calculation: 431 R Axis:   -47 Text Interpretation:  Sinus rhythm Left anterior fascicular block Consider anterior infarct Confirmed by Margarita Grizzle [phone]) on 05/09/2017 5:07:56 PM       Radiology No results found.  Procedures Procedures (including critical care time)  Medications Ordered in ED Medications - No data to display   Initial Impression / Assessment and Plan / ED Course  I have reviewed the triage vital signs and the nursing notes.  Pertinent labs & imaging results that were available during my care of the patient were reviewed by me and considered in my medical decision  making (see chart for details).     1 gastroparesis 2 volume depletion 3 acute kidney injury with creatinine increased from 3.01 on discharged on 20 624.06 today 4 hyponatremia 5 anemia appears stable  Final Clinical Impressions(s) / ED Diagnoses   Final diagnoses:  Gastroparesis  Intractable vomiting, presence of nausea not specified, unspecified vomiting type  Volume depletion  Hypernatremia  AKI (acute kidney injury) [facility])    New Prescriptions New Prescriptions   No medications on file     Margarita Grizzle, MD 05/09/17 2337

## 2017-05-09 NOTE — ED Notes (Signed)
Attempted report 

## 2017-05-10 ENCOUNTER — Observation Stay (HOSPITAL_COMMUNITY): Payer: Medicare Other

## 2017-05-10 DIAGNOSIS — D638 Anemia in other chronic diseases classified elsewhere: Secondary | ICD-10-CM | POA: Diagnosis not present

## 2017-05-10 DIAGNOSIS — I10 Essential (primary) hypertension: Secondary | ICD-10-CM | POA: Diagnosis not present

## 2017-05-10 DIAGNOSIS — K3184 Gastroparesis: Secondary | ICD-10-CM | POA: Diagnosis not present

## 2017-05-10 DIAGNOSIS — N189 Chronic kidney disease, unspecified: Secondary | ICD-10-CM | POA: Diagnosis not present

## 2017-05-10 DIAGNOSIS — N179 Acute kidney failure, unspecified: Secondary | ICD-10-CM | POA: Diagnosis not present

## 2017-05-10 DIAGNOSIS — E1122 Type 2 diabetes mellitus with diabetic chronic kidney disease: Secondary | ICD-10-CM | POA: Diagnosis not present

## 2017-05-10 DIAGNOSIS — R109 Unspecified abdominal pain: Secondary | ICD-10-CM | POA: Diagnosis not present

## 2017-05-10 DIAGNOSIS — K219 Gastro-esophageal reflux disease without esophagitis: Secondary | ICD-10-CM | POA: Diagnosis not present

## 2017-05-10 LAB — CBC
HCT: 27.3 % — ABNORMAL LOW (ref 36.0–46.0)
HEMOGLOBIN: 8.5 g/dL — AB (ref 12.0–15.0)
MCH: 27.7 pg (ref 26.0–34.0)
MCHC: 31.1 g/dL (ref 30.0–36.0)
MCV: 88.9 fL (ref 78.0–100.0)
Platelets: 335 10*3/uL (ref 150–400)
RBC: 3.07 MIL/uL — ABNORMAL LOW (ref 3.87–5.11)
RDW: 14.4 % (ref 11.5–15.5)
WBC: 5.5 10*3/uL (ref 4.0–10.5)

## 2017-05-10 LAB — COMPREHENSIVE METABOLIC PANEL
ALBUMIN: 2.4 g/dL — AB (ref 3.5–5.0)
ALK PHOS: 19 U/L — AB (ref 38–126)
ALT: 20 U/L (ref 14–54)
ANION GAP: 7 (ref 5–15)
AST: 21 U/L (ref 15–41)
BILIRUBIN TOTAL: 0.3 mg/dL (ref 0.3–1.2)
BUN: 14 mg/dL (ref 6–20)
CALCIUM: 8.8 mg/dL — AB (ref 8.9–10.3)
CO2: 27 mmol/L (ref 22–32)
Chloride: 110 mmol/L (ref 101–111)
Creatinine, Ser: 3.45 mg/dL — ABNORMAL HIGH (ref 0.44–1.00)
GFR calc Af Amer: 17 mL/min — ABNORMAL LOW (ref >60)
GFR, EST NON AFRICAN AMERICAN: 15 mL/min — AB (ref >60)
GLUCOSE: 78 mg/dL (ref 65–99)
Potassium: 3.5 mmol/L (ref 3.5–5.1)
Sodium: 144 mmol/L (ref 135–145)
TOTAL PROTEIN: 4.7 g/dL — AB (ref 6.5–8.1)

## 2017-05-10 LAB — GLUCOSE, CAPILLARY
GLUCOSE-CAPILLARY: 72 mg/dL (ref 65–99)
GLUCOSE-CAPILLARY: 114 mg/dL — AB (ref 65–99)
GLUCOSE-CAPILLARY: 113 mg/dL — AB (ref 65–99)
Glucose-Capillary: 77 mg/dL (ref 65–99)

## 2017-05-10 LAB — TSH: TSH: 1.746 u[IU]/mL (ref 0.350–4.500)

## 2017-05-10 MED ORDER — SODIUM CHLORIDE 0.9 % IV SOLN
INTRAVENOUS | Status: DC
  Administered 2017-05-10 – 2017-05-12 (×4): via INTRAVENOUS
  Filled 2017-05-10 (×7): qty 1000

## 2017-05-10 NOTE — Progress Notes (Signed)
Pt only able to drink one bottle of CT oral contrast. CT department notified. Will continue to monitor.

## 2017-05-10 NOTE — Progress Notes (Signed)
Initial Nutrition Assessment  DOCUMENTATION CODES:   Non-severe (moderate) malnutrition in context of chronic illness  INTERVENTION:   -Provided pt with "Gastroparesis Nutrition Therapy" handout and reviewed basics of diet. Reinforced importance of smaller, more frequent meals. Avoiding high fat and high fiber foods, chewing foods well prior to swallowing etc. Pt verbalizes that she does not know what gastroparesis is; provided brief explanation. Pt verbalizes understanding but appears to have very limited ability to grasp concept (noted pt with hx of developmental delay). Pt reports she lives with her Father and Brother. Reports father helps her manage her DM; will attempt to speak with Father on follow-up  -Diet advancement as able to Soft diet  -Continue Boost Breeze; consider switching supplement once diet advanced  NUTRITION DIAGNOSIS:   Malnutrition (Moderate) related to chronic illness (DM with gastroparesis) as evidenced by mild depletion of muscle mass, percent weight loss.  GOAL:   Patient will meet greater than or equal to 90% of their needs  MONITOR:   PO intake, Supplement acceptance, Labs, Weight trends  REASON FOR ASSESSMENT:   Consult Assessment of nutrition requirement/status  ASSESSMENT:   46 yo female admitted with intractable N/V in setting of diabetic gastroparesis, AKI on CKD IV with hypernatremia in setting of poor po intake, dehydration. Pt with hx of HTN, DM with hx of gastroparesis, pancreatitis. Per MD note, pt is poor historian with some developmental delay  Pt tolerating small amounts of CL diet, recorded po intake 25% last night. Pt did not eat breakfast this AM due to chest pain. Pt reports minimal intake for several days PTA due to N/V but reports eating well prior to this. Pt did not elaborate further.   Pt reports UBW around 161 pounds but is unsure when she last weighed this. Current wt of 137 pounds (14.9% wt loss).  Per weight encounters, pt  with 21.2% wt loss in 2 months.   Nutrition-Focused physical exam completed. Findings are no fat depletion, mild muscle depletion, and no edema.   Labs: Creatinine 3.45 (H, improving), BUN wdl, sodium wdl, corrected calcium 10.1 (albumin 2.4) Meds: reglan, ss novolog, NS at 100 ml/hr, zofran prn, Vitamin D  Diet Order:  Diet clear liquid Room service appropriate? Yes; Fluid consistency: Thin  Skin:  Reviewed, no issues  Last BM:  no documented BM  Height:   Ht Readings from Last 1 Encounters:  03/26/17 5' (1.524 m)    Weight:   Wt Readings from Last 1 Encounters:  05/09/17 137 lb 9.6 oz (62.4 kg)    BMI:  Body mass index is 26.87 kg/m.  Estimated Nutritional Needs:   Kcal:  [id number] kcals  Protein:  84-93 g  Fluid:  >/= 1.7 L  EDUCATION NEEDS:   Education needs addressed  Pamela Starcherate Pamela Gasparro MS, RD, LDN [phone] Pager  [phone] Weekend/On-Call Pager

## 2017-05-10 NOTE — Evaluation (Signed)
Physical Therapy Evaluation Patient Details Name: Pamela Goodwin MRN: [id number] DOB: 08/24/1971 Today's Date: 05/10/2017   History of Present Illness  Pamela Goodwin is a 46 y.o. female with medical history significant of HTN, Diabetes Mellitus Type 2 with Gastroparesis, Hx of Pancreatitis, Developmental delay, Renal Disease  (being worked up with Nephrology as an outpatient); Admitted with Nausea and vomiting  Clinical Impression   Pt admitted with above diagnosis. Pt currently with functional limitations due to the deficits listed below (see PT Problem List).  Pt will benefit from skilled PT to increase their independence and safety with mobility to allow discharge to the venue listed below.       Follow Up Recommendations No PT follow up;Supervision/Assistance - 24 hour    Equipment Recommendations  None recommended by PT    Recommendations for Other Services       Precautions / Restrictions Precautions Precautions: Fall      Mobility  Bed Mobility Overal bed mobility: Modified Independent             General bed mobility comments: incr time  Transfers Overall transfer level: Needs assistance Equipment used: 1 person hand held assist Transfers: Sit to/from Stand Sit to Stand: Min guard         General transfer comment: mingurad for safety; reported dizziness; cues to self-monitor for activity tolerance  Ambulation/Gait Ambulation/Gait assistance: Min guard Ambulation Distance (Feet): 100 Feet Assistive device: Rolling walker (2 wheeled);None Gait Pattern/deviations: Step-through pattern;Decreased step length - right;Decreased step length - left   Gait velocity interpretation: Below normal speed for age/gender General Gait Details: initiated walk with RW; smooth progression; second half of walk without and noted incr step width, decr weight shift fully onto stance LE, and short staccato steps  Stairs            Wheelchair Mobility    Modified Rankin  (Stroke Patients Only)       Balance Overall balance assessment: Needs assistance           Standing balance-Leahy Scale: Fair                               Pertinent Vitals/Pain Pain Assessment: No/denies pain    Home Living Family/patient expects to be discharged to:: Private residence Living Arrangements: Parent;Other relatives (Live with Father and brother) Available Help at Discharge: Family;Available 24 hours/day Type of Home: House Home Access: Stairs to enter Entrance Stairs-Rails: None Entrance Stairs-Number of Steps: 3 Home Layout: Two level;Able to live on main level with bedroom/bathroom Home Equipment: Bedside commode      Prior Function Level of Independence: Needs assistance         Comments: Reports she was independent prior to getting sick.      Hand Dominance   Dominant Hand: Right    Extremity/Trunk Assessment   Upper Extremity Assessment Upper Extremity Assessment: Overall WFL for tasks assessed    Lower Extremity Assessment Lower Extremity Assessment: Generalized weakness       Communication   Communication: No difficulties  Cognition Arousal/Alertness: Awake/alert Behavior During Therapy: WFL for tasks assessed/performed Overall Cognitive Status: History of cognitive impairments - at baseline                                        General Comments General comments (skin integrity, edema, etc.): No nausea  during walk    Exercises     Assessment/Plan    PT Assessment Patient needs continued PT services  PT Problem List Decreased strength;Decreased activity tolerance;Decreased balance;Decreased mobility;Decreased coordination;Decreased cognition;Decreased knowledge of use of DME       PT Treatment Interventions DME instruction;Gait training;Stair training;Functional mobility training;Therapeutic activities;Therapeutic exercise;Balance training;Patient/family education;Cognitive remediation     PT Goals (Current goals can be found in the Care Plan section)  Acute Rehab PT Goals Patient Stated Goal: feel better soon PT Goal Formulation: With patient Time For Goal Achievement: 05/24/17 Potential to Achieve Goals: Good    Frequency Min 3X/week   Barriers to discharge        Co-evaluation               AM-PAC PT "6 Clicks" Daily Activity  Outcome Measure Difficulty turning over in bed (including adjusting bedclothes, sheets and blankets)?: None Difficulty moving from lying on back to sitting on the side of the bed? : A Little Difficulty sitting down on and standing up from a chair with arms (e.g., wheelchair, bedside commode, etc,.)?: A Little Help needed moving to and from a bed to chair (including a wheelchair)?: None Help needed walking in hospital room?: A Little Help needed climbing 3-5 steps with a railing? : A Little 6 Click Score: 20    End of Session Equipment Utilized During Treatment: Gait belt Activity Tolerance: Patient tolerated treatment well Patient left: in chair;with call bell/phone within reach;with family/visitor present Nurse Communication: Mobility status PT Visit Diagnosis: Unsteadiness on feet (R26.81)    Time: [id number] PT Time Calculation (min) (ACUTE ONLY): 17 min   Charges:   PT Evaluation $PT Eval Low Complexity: 1 Low     PT G Codes:   PT G-Codes **NOT FOR INPATIENT CLASS** Functional Assessment Tool Used: Clinical judgement Functional Limitation: Mobility: Walking and moving around Mobility: Walking and Moving Around Current Status ([id number](G8978): At least 1 percent but less than 20 percent impaired, limited or restricted Mobility: Walking and Moving Around Goal Status [phone](G8979): 0 percent impaired, limited or restricted    Van ClinesHolly Rakisha Pincock, PT  Acute Rehabilitation Services Pager [phone] Office [phone]   Levi AlandHolly H Khya Halls 05/10/2017, 4:37 PM

## 2017-05-10 NOTE — Care Management Obs Status (Signed)
MEDICARE OBSERVATION STATUS NOTIFICATION   Patient Details  Name: Pamela LoraJewel Mounts MRN: [id number] Date of Birth: 10-Aug-1971   Medicare Observation Status Notification Given:  Yes    Mazin Emma, Annamarie MajorCheryl U, RN 05/10/2017, 11:18 AM

## 2017-05-10 NOTE — Progress Notes (Signed)
PROGRESS NOTE  Pamela Goodwin  ZOX:[id number] DOB: 1971/09/04 DOA: 05/09/2017 PCP: [name], MD Outpatient Specialists:  Subjective: Denies any nausea or vomiting this morning, denies any dental pain. I shared with her the findings of the CT scan of abdomen pelvis.  Brief Narrative:  Pamela Goodwin is a 46 y.o. female with medical history significant of HTN, Diabetes Mellitus Type 2 with Gastroparesis, Hx of Pancreatitis, Renal Disease currently being worked up with Nephrology as an outpatient and other comorbids who presented to [facility] ED with a cc of Nausea and Vomiting that started this AM. She had a similar hospitalization back in July of this year. Patient is a poor historian and likely has some developmental delay so history was gathered from the patient as well as the sister. The patient's sister states the patient has had poor po intake recently and on Saturday she started having Abdominal Pain. Abdominal pain spontaneously resolved and last night the patient had tacos for dinner and this AM she developed bilious N/V. Patient states that she was "throwing up acid." Patient states she had diarrhea but then when asked in detail wasn't true diarrhea and just a normal bowel movement per sister. Patient is unable to keep any foods or liquids down. Sister also states patient has been very fatigued and has been laying in bed weak from not eating properly. Patient's only other complaint is being cold. Patient Denied any current abdominal pain and denied dysuria, CP or SOB. TRH was called to admit for Intractable N/V with Dehydration and an inability to keep anything po down.  Assessment & Plan:   Active Problems:   Acute renal failure (ARF) (HCC)   Intractable nausea and vomiting   Acute kidney injury superimposed on chronic kidney disease (HCC)   Abdominal pain   Hypercalcemia   GERD (gastroesophageal reflux disease)   Anemia of chronic disease   Controlled diabetes mellitus (HCC)  Hypernatremia   Diabetic gastroparesis (HCC)   Essential hypertension   Intractable Nausea and Vomiting in the setting of Diabetic Gastroparesis -Obtain CT Abd/Pelvis w/o Contrast due to Kidney Fxn, Recent Gastric Emptying Study showed Delayed Emptying. -Lipase Normal  -Differential includes Known Gastroparesis given history of diabetes vs. Nausea from other systemic pathology. -Started on Reglan 10 mg every 8 hours, symptomatically treatment with Zofran. -Start with Clear Liquid Diet and Advance diet as tolerated.  Recent Intermittent Epigastric Abdominal Pain -Lipase 45   -Check CT Abd/Pelvis given patient's previous history of pancreatitis question possibility of pseudocyst or some obstructive abnormality. -Had Recent Gastric Emptying Study   AKI on CKD Stage IV -Likely in the setting of poor po Intake and N/V -U/A Still pending, Avoid Nephrotoxic Agents -BUN/Cr elevated at 17/4.06; Last Value was 22/3.01  -Given IVF Bolus of 1 Liter; Continue IVF at 100 mL/hr -Follow renal function closely, check BMP in a.m.  Hypernatremia -Na+ was 148, likely from dehydration. -This is improved with IV fluid hydration sodium is 144 today.  Hypercalcemia  -Patient's initial calcium level elevated at 10.4.  -Check Ionized Calcium in AM and C/w IVF Rehydration' -Repeat CMP in AM   Diabetes Mellitus Type 2  -Last HbA1c was 6.4 -Hold Amaryl and likley need to D/C at Discharge given risk of Hypoglycemia -Placed on Sensitive Novolog SSI -Continue to Monitor CBG's  Hypertension -C/w Metoprolol 25 mg po BID and Amlodipine 10 mg po Daily  Generalized Weakness -Likely from [facility] and poor po Intake -PT/OT Eval  GERD -C/w Pantoprazole 40 mg po BID  Normocytic  Anemia likely of Chronic Disease -Hb/Hct was 10.1/31.6 -Likely in the setting of Renal Disease -Continue to Monitor and evaluate for S/Sx of Bleeding -Repeat CBC in AM    DVT prophylaxis:  Code Status: Full  Code Family Communication:  Disposition Plan:  Diet: Diet clear liquid Room service appropriate? Yes; Fluid consistency: Thin  Consultants:   None  Procedures:   None  Antimicrobials:   None   Objective: Vitals:   05/09/17 1859 05/09/17 2040 05/10/17 0509 05/10/17 0935  BP: (!) 149/103 (!) 163/101 (!) 138/91 130/88  Pulse: (!) 110 (!) 110 (!) 102 (!) 104  Resp: 16 (!) 24 (!) 23 20  Temp: 97.9 F (36.6 C) 97.7 F (36.5 C) (!) 97.5 F (36.4 C) (!) 97.5 F (36.4 C)  TempSrc: Oral Oral Oral Oral  SpO2: 97% 98% 95% 98%  Weight:  62.4 kg (137 lb 9.6 oz)      Intake/Output Summary (Last 24 hours) at 05/10/17 1222 Last data filed at 05/10/17 0900  Gross per 24 hour  Intake          2356.67 ml  Output                0 ml  Net          2356.67 ml   Filed Weights   05/09/17 1341 05/09/17 2040  Weight: 71.2 kg (157 lb) 62.4 kg (137 lb 9.6 oz)    Examination: General exam: Appears calm and comfortable  Respiratory system: Clear to auscultation. Respiratory effort normal. Cardiovascular system: S1 & S2 heard, RRR. No JVD, murmurs, rubs, gallops or clicks. No pedal edema. Gastrointestinal system: Abdomen is nondistended, soft and nontender. No organomegaly or masses felt. Normal bowel sounds heard. Central nervous system: Alert and oriented. No focal neurological deficits. Extremities: Symmetric 5 x 5 power. Skin: No rashes, lesions or ulcers Psychiatry: Judgement and insight appear normal. Mood & affect appropriate.   Data Reviewed: I have personally reviewed following labs and imaging studies  CBC:  Recent Labs Lab 05/09/17 1346 05/10/17 0728  WBC 5.4 5.5  HGB 10.1* 8.5*  HCT 31.6* 27.3*  MCV 87.8 88.9  PLT 347 335   Basic Metabolic Panel:  Recent Labs Lab 05/09/17 1346 05/10/17 0728  NA 148* 144  K 3.9 3.5  CL 105 110  CO2 31 27  GLUCOSE 112* 78  BUN 17 14  CREATININE 4.06* 3.45*  CALCIUM 10.4* 8.8*   GFR: Estimated Creatinine Clearance: 17  mL/min (A) (by C-G formula based on SCr of 3.45 mg/dL (H)). Liver Function Tests:  Recent Labs Lab 05/09/17 1346 05/10/17 0728  AST 30 21  ALT 26 20  ALKPHOS 25* 19*  BILITOT 0.4 0.3  PROT 6.1* 4.7*  ALBUMIN 3.1* 2.4*    Recent Labs Lab 05/09/17 1346  LIPASE 45   No results for input(s): AMMONIA in the last 168 hours. Coagulation Profile: No results for input(s): INR, PROTIME in the last 168 hours. Cardiac Enzymes: No results for input(s): CKTOTAL, CKMB, CKMBINDEX, TROPONINI in the last 168 hours. BNP (last 3 results) No results for input(s): PROBNP in the last 8760 hours. HbA1C: No results for input(s): HGBA1C in the last 72 hours. CBG:  Recent Labs Lab 05/10/17 0726 05/10/17 1114  GLUCAP 72 77   Lipid Profile: No results for input(s): CHOL, HDL, LDLCALC, TRIG, CHOLHDL, LDLDIRECT in the last 72 hours. Thyroid Function Tests:  Recent Labs  05/10/17 0728  TSH 1.746   Anemia Panel: No results for input(s):  VITAMINB12, FOLATE, FERRITIN, TIBC, IRON, RETICCTPCT in the last 72 hours. Urine analysis:    Component Value Date/Time   COLORURINE YELLOW 03/26/2017 0420   APPEARANCEUR CLEAR 03/26/2017 0420   LABSPEC 1.020 03/26/2017 0420   PHURINE 6.0 03/26/2017 0420   GLUCOSEU NEGATIVE 03/26/2017 0420   HGBUR MODERATE (A) 03/26/2017 0420   BILIRUBINUR NEGATIVE 03/26/2017 0420   KETONESUR 15 (A) 03/26/2017 0420   PROTEINUR >300 (A) 03/26/2017 0420   NITRITE NEGATIVE 03/26/2017 0420   LEUKOCYTESUR NEGATIVE 03/26/2017 0420   Sepsis Labs: @LABRCNTIP (procalcitonin:4,lacticidven:4)  )No results found for this or any previous visit (from the past 240 hour(s)).   Invalid input(s): PROCALCITONIN, LACTICACIDVEN   Radiology Studies: Ct Abdomen Pelvis Wo Contrast  Result Date: 05/10/2017 CLINICAL DATA:  46 y/o  F; severe abdominal pain. EXAM: CT ABDOMEN AND PELVIS WITHOUT CONTRAST TECHNIQUE: Multidetector CT imaging of the abdomen and pelvis was performed following  the standard protocol without IV contrast. COMPARISON:  03/26/2017 CT abdomen and pelvis FINDINGS: Lower chest: No acute abnormality. Hepatobiliary: No focal liver abnormality is seen. No gallstones, gallbladder wall thickening, or biliary dilatation. Pancreas: Unremarkable. Spleen: Normal in size without focal abnormality. Adrenals/Urinary Tract: Adrenal glands are unremarkable. Kidneys are normal, without renal calculi, focal lesion, or hydronephrosis. Bladder is unremarkable. Stomach/Bowel: Stomach is within normal limits. Appendix appears normal. No evidence of bowel wall thickening, distention, or inflammatory changes. Vascular/Lymphatic: Aortic atherosclerosis. No enlarged abdominal or pelvic lymph nodes. Reproductive: Uterus and bilateral adnexa are unremarkable. IUD well seated with the uterine fundus. Other: No abdominal wall hernia or abnormality. No abdominopelvic ascites. Musculoskeletal: No acute or significant osseous findings. IMPRESSION: No acute process identified as explanation for pain. No acute peripancreatic collection. Electronically Signed   By: Mitzi HansenLance  Furusawa-Stratton M.D.   On: 05/10/2017 01:39     Scheduled Meds: . amLODipine  10 mg Oral Daily  . feeding supplement  1 Container Oral TID BM  . heparin  5,000 Units Subcutaneous Q8H  . insulin aspart  0-9 Units Subcutaneous TID WC  . metoCLOPramide (REGLAN) injection  10 mg Intravenous Q8H  . metoprolol tartrate  25 mg Oral BID  . pantoprazole  40 mg Oral BID  . [START ON 05/11/2017] Vitamin D (Ergocalciferol)  50,000 Units Oral Q Wed   Continuous Infusions: . sodium chloride 100 mL/hr at 05/10/17 0611     LOS: 0 days    Time spent: 35 minutes    Clint LippsMutaz A Kahlee Metivier, MD [facility] Pager [phone]  If 7PM-7AM, please contact night-coverage [email or web address] Password [email or web address] 05/10/2017, 12:22 PM

## 2017-05-11 DIAGNOSIS — E440 Moderate protein-calorie malnutrition: Secondary | ICD-10-CM | POA: Diagnosis present

## 2017-05-11 DIAGNOSIS — D638 Anemia in other chronic diseases classified elsewhere: Secondary | ICD-10-CM | POA: Diagnosis present

## 2017-05-11 DIAGNOSIS — Z6825 Body mass index (BMI) 25.0-25.9, adult: Secondary | ICD-10-CM | POA: Diagnosis not present

## 2017-05-11 DIAGNOSIS — K3184 Gastroparesis: Secondary | ICD-10-CM | POA: Diagnosis present

## 2017-05-11 DIAGNOSIS — E1143 Type 2 diabetes mellitus with diabetic autonomic (poly)neuropathy: Secondary | ICD-10-CM | POA: Diagnosis present

## 2017-05-11 DIAGNOSIS — Z79899 Other long term (current) drug therapy: Secondary | ICD-10-CM | POA: Diagnosis not present

## 2017-05-11 DIAGNOSIS — K219 Gastro-esophageal reflux disease without esophagitis: Secondary | ICD-10-CM | POA: Diagnosis present

## 2017-05-11 DIAGNOSIS — E860 Dehydration: Secondary | ICD-10-CM | POA: Diagnosis present

## 2017-05-11 DIAGNOSIS — I129 Hypertensive chronic kidney disease with stage 1 through stage 4 chronic kidney disease, or unspecified chronic kidney disease: Secondary | ICD-10-CM | POA: Diagnosis present

## 2017-05-11 DIAGNOSIS — E669 Obesity, unspecified: Secondary | ICD-10-CM | POA: Diagnosis present

## 2017-05-11 DIAGNOSIS — N179 Acute kidney failure, unspecified: Secondary | ICD-10-CM | POA: Diagnosis present

## 2017-05-11 DIAGNOSIS — R109 Unspecified abdominal pain: Secondary | ICD-10-CM | POA: Diagnosis not present

## 2017-05-11 DIAGNOSIS — E1122 Type 2 diabetes mellitus with diabetic chronic kidney disease: Secondary | ICD-10-CM | POA: Diagnosis present

## 2017-05-11 DIAGNOSIS — Z91013 Allergy to seafood: Secondary | ICD-10-CM | POA: Diagnosis not present

## 2017-05-11 DIAGNOSIS — I10 Essential (primary) hypertension: Secondary | ICD-10-CM | POA: Diagnosis not present

## 2017-05-11 DIAGNOSIS — E870 Hyperosmolality and hypernatremia: Secondary | ICD-10-CM | POA: Diagnosis present

## 2017-05-11 DIAGNOSIS — N189 Chronic kidney disease, unspecified: Secondary | ICD-10-CM | POA: Diagnosis not present

## 2017-05-11 DIAGNOSIS — R112 Nausea with vomiting, unspecified: Secondary | ICD-10-CM | POA: Diagnosis present

## 2017-05-11 DIAGNOSIS — Z888 Allergy status to other drugs, medicaments and biological substances status: Secondary | ICD-10-CM | POA: Diagnosis not present

## 2017-05-11 DIAGNOSIS — N184 Chronic kidney disease, stage 4 (severe): Secondary | ICD-10-CM | POA: Diagnosis present

## 2017-05-11 LAB — GLUCOSE, CAPILLARY
Glucose-Capillary: 90 mg/dL (ref 65–99)
Glucose-Capillary: 123 mg/dL — ABNORMAL HIGH (ref 65–99)
Glucose-Capillary: 136 mg/dL — ABNORMAL HIGH (ref 65–99)
Glucose-Capillary: 110 mg/dL — ABNORMAL HIGH (ref 65–99)

## 2017-05-11 NOTE — Evaluation (Signed)
Occupational Therapy Evaluation Patient Details Name: Pamela Goodwin MRN: [id number] DOB: 04/06/1971 Today's Date: 05/11/2017    History of Present Illness Pamela Goodwin is a 46 y.o. female with medical history significant of HTN, Diabetes Mellitus Type 2 with Gastroparesis, Hx of Pancreatitis, Developmental delay, Renal Disease  (being worked up with Nephrology as an outpatient); Admitted with Nausea and vomiting   Clinical Impression   Patient presenting with decreased I with self care, balance, safety awareness, and I with functional transfer.Patient reports being I PTA. Patient currently functioning at steady assistance level. Patient will benefit from acute OT to increase overall independence in the areas of ADLs, functional mobility, and safety in order to safely discharge home with family.    Follow Up Recommendations  No OT follow up;Supervision/Assistance - 24 hour    Equipment Recommendations  None recommended by OT       Precautions / Restrictions Precautions Precautions: Fall Restrictions Weight Bearing Restrictions: No      Mobility Bed Mobility Overal bed mobility: Modified Independent      General bed mobility comments: incr time  Transfers Overall transfer level: Needs assistance Equipment used: 1 person hand held assist Transfers: Sit to/from Stand Sit to Stand: Min guard         General transfer comment: steady assistance for safety    Balance Overall balance assessment: Needs assistance Sitting-balance support: Feet supported Sitting balance-Leahy Scale: Good     Standing balance support: During functional activity Standing balance-Leahy Scale: Fair       ADL either performed or assessed with clinical judgement   ADL Overall ADL's : Needs assistance/impaired       General ADL Comments: steady assistance overall for self care tasks. Pt ambulates with HHA from bed >bathroom for toileting with steady assistance while pt holding onto IV pole.       Vision Baseline Vision/History: No visual deficits Patient Visual Report: No change from baseline              Pertinent Vitals/Pain Pain Assessment: No/denies pain     Hand Dominance Right   Extremity/Trunk Assessment Upper Extremity Assessment Upper Extremity Assessment: Generalized weakness   Lower Extremity Assessment Lower Extremity Assessment: Generalized weakness       Communication Communication Communication: No difficulties   Cognition Arousal/Alertness: Awake/alert Behavior During Therapy: WFL for tasks assessed/performed Overall Cognitive Status: History of cognitive impairments - at baseline                       Home Living Family/patient expects to be discharged to:: Private residence Living Arrangements: Parent;Other relatives;Other (Comment) (lives with father and brother) Available Help at Discharge: Family;Available 24 hours/day Type of Home: House Home Access: Stairs to enter [employer] of Steps: 3 Entrance Stairs-Rails: None Home Layout: Two level;Able to live on main level with bedroom/bathroom     Bathroom Shower/Tub: Producer, television/film/video: Standard     Home Equipment: Bedside commode          Prior Functioning/Environment Level of Independence: Needs assistance        Comments: Reports she was independent prior to getting sick.         OT Problem List: Decreased strength;Decreased activity tolerance;Impaired balance (sitting and/or standing);Decreased safety awareness;Impaired sensation;Pain;Decreased knowledge of use of DME or AE;Decreased coordination;Cardiopulmonary status limiting activity;Decreased cognition      OT Treatment/Interventions: Self-care/ADL training;Therapeutic exercise;Neuromuscular education;Energy conservation;DME and/or AE instruction;Cognitive remediation/compensation;Therapeutic activities;Balance training;Patient/family education    OT Goals(Current  goals can be  found in the care plan section) Acute Rehab OT Goals Patient Stated Goal: to go back home OT Goal Formulation: With patient Time For Goal Achievement: 05/25/17 Potential to Achieve Goals: Good ADL Goals Pt Will Perform Grooming: with supervision Pt Will Perform Upper Body Bathing: with supervision Pt Will Perform Lower Body Bathing: with supervision Pt Will Perform Upper Body Dressing: with supervision Pt Will Perform Lower Body Dressing: with supervision Pt Will Transfer to Toilet: with supervision Pt Will Perform Toileting - Clothing Manipulation and hygiene: with supervision Pt Will Perform Tub/Shower Transfer: with supervision;shower seat  OT Frequency: Min 2X/week   Barriers to D/C:    none known at this time          AM-PAC PT "6 Clicks" Daily Activity     Outcome Measure Help from another person eating meals?: None Help from another person taking care of personal grooming?: None Help from another person toileting, which includes using toliet, bedpan, or urinal?: A Little Help from another person bathing (including washing, rinsing, drying)?: A Little Help from another person to put on and taking off regular upper body clothing?: A Little Help from another person to put on and taking off regular lower body clothing?: A Little 6 Click Score: 20   End of Session    Activity Tolerance: Patient tolerated treatment well Patient left: in bed;with bed alarm set  OT Visit Diagnosis: Unsteadiness on feet (R26.81);Muscle weakness (generalized) (M62.81)                Time: [id number] OT Time Calculation (min): 16 min Charges:  OT General Charges $OT Visit: 1 Visit OT Evaluation $OT Eval Moderate Complexity: 1 Mod G-Codes: OT G-codes **NOT FOR INPATIENT CLASS** Functional Assessment Tool Used: AM-PAC 6 Clicks Daily Activity;Clinical judgement Functional Limitation: Self care Self Care Current Status (V4098(G8987): At least 20 percent but less than 40 percent impaired, limited or  restricted Self Care Goal Status (J1914(G8988): At least 1 percent but less than 20 percent impaired, limited or restricted    Alen BleacherBradsher, Emmerson Shuffield P, MS, OTR/L, CBIS 05/11/2017, 1:49 PM

## 2017-05-11 NOTE — Care Management Note (Signed)
Case Management Note  Patient Details  Name: Wyvonnia LoraJewel Surprenant MRN: [id number] Date of Birth: Feb 16, 1971  Subjective/Objective:                 Patient admitted from home w spouse for Intractable Nausea and Vomiting in the setting of Diabetic Gastroparesis. Patient continues w nausea, IVF and clear liquid diet.    Action/Plan:  CM will continue to follow for DC planning.   Expected Discharge Date:                  Expected Discharge Plan:  Home/Self Care  In-House Referral:     Discharge planning Services  CM Consult  Post Acute Care Choice:    Choice offered to:     DME Arranged:    DME Agency:     HH Arranged:    HH Agency:     Status of Service:  In process, will continue to follow  If discussed at Long Length of Stay Meetings, dates discussed:    Additional Comments:  Lawerance SabalDebbie Bonnita Newby, RN 05/11/2017, 11:49 AM

## 2017-05-11 NOTE — Progress Notes (Signed)
PROGRESS NOTE  Pamela Goodwin  JYN:[id number] DOB: February 22, 1971 DOA: 05/09/2017 PCP: Kirstie PeriShah, Ashish, MD Outpatient Specialists:  Subjective: She still has significant nausea this morning after she ate her breakfast, but denies any vomiting or abdominal pain. Denies any other complaints including fever or chills.  Brief Narrative:  Pamela Goodwin is a 46 y.o. female with medical history significant of HTN, Diabetes Mellitus Type 2 with Gastroparesis, Hx of Pancreatitis, Renal Disease currently being worked up with Nephrology as an outpatient and other comorbids who presented to [facility] with a cc of Nausea and Vomiting that started this AM. She had a similar hospitalization back in July of this year. Patient is a poor historian and likely has some developmental delay so history was gathered from the patient as well as the sister. The patient's sister states the patient has had poor po intake recently and on Saturday she started having Abdominal Pain. Abdominal pain spontaneously resolved and last night the patient had tacos for dinner and this AM she developed bilious N/V. Patient states that she was "throwing up acid." Patient states she had diarrhea but then when asked in detail wasn't true diarrhea and just a normal bowel movement per sister. Patient is unable to keep any foods or liquids down. Sister also states patient has been very fatigued and has been laying in bed weak from not eating properly. Patient's only other complaint is being cold. Patient Denied any current abdominal pain and denied dysuria, CP or SOB. TRH was called to admit for Intractable N/V with Dehydration and an inability to keep anything po down.  Assessment & Plan:   Active Problems:   Acute renal failure (ARF) (HCC)   Intractable nausea and vomiting   Acute kidney injury superimposed on chronic kidney disease (HCC)   Abdominal pain   Hypercalcemia   GERD (gastroesophageal reflux disease)   Anemia of chronic disease  Controlled diabetes mellitus (HCC)   Hypernatremia   Diabetic gastroparesis (HCC)   Essential hypertension   Intractable Nausea and Vomiting in the setting of Diabetic Gastroparesis -Obtain CT Abd/Pelvis w/o Contrast due to Kidney Fxn, Recent Gastric Emptying Study showed Delayed Emptying. -Lipase Normal  -Differential includes Known Gastroparesis given history of diabetes vs. Nausea from other systemic pathology. -Started on Reglan 10 mg every 8 hours, symptomatically treatment with Zofran. -Continue clear liquids for now, advance diet to full liquids when nausea resolves.  Recent Intermittent Epigastric Abdominal Pain -Lipase 45   -Check CT Abd/Pelvis given patient's previous history of pancreatitis question possibility of pseudocyst or some obstructive abnormality. -Had Recent Gastric Emptying Study   AKI on CKD Stage IV -Likely in the setting of poor po Intake and N/V -U/A Still pending, Avoid Nephrotoxic Agents -BUN/Cr elevated at 17/4.06; Last Value was 22/3.01  -Check BMP in a.m.  Hypernatremia -Na+ was 148, likely from dehydration. -This is improved with IV fluid hydration sodium is 144 today.  Hypercalcemia  -Patient's initial calcium level elevated at 10.4.  -Check Ionized Calcium in AM and C/w IVF Rehydration' -Repeat CMP in AM   Diabetes Mellitus Type 2  -Last HbA1c was 6.4 -Hold Amaryl and likley need to D/C at Discharge given risk of Hypoglycemia -Placed on Sensitive Novolog SSI -Continue to Monitor CBG's  Hypertension -C/w Metoprolol 25 mg po BID and Amlodipine 10 mg po Daily  Generalized Weakness [facility] -PT/OT Eval  GERD -C/w Pantoprazole 40 mg po BID  Normocytic Anemia likely of Chronic Disease -Hb/Hct was 10.1/31.6 -Likely  in the setting of Renal Disease -Hemoglobin is 8.5, repeat CBC in a.m.    DVT prophylaxis:  Code Status: Full Code Family Communication:  Disposition Plan:  Diet: Diet clear  liquid Room service appropriate? Yes; Fluid consistency: Thin  Consultants:   None  Procedures:   None  Antimicrobials:   None   Objective: Vitals:   05/10/17 2100 05/11/17 0617 05/11/17 0900 05/11/17 0937  BP: (!) 145/98 135/82  120/75  Pulse: (!) 103 (!) 106  92  Resp: 19 19  20   Temp: 98.7 F (37.1 C) 98.6 F (37 C)  98.2 F (36.8 C)  TempSrc: Oral Oral  Oral  SpO2: 96% 98%  98%  Weight: 62.3 kg (137 lb 5.6 oz)     Height:   5\' 3"  (1.6 m)     Intake/Output Summary (Last 24 hours) at 05/11/17 1103 Last data filed at 05/11/17 0900  Gross per 24 hour  Intake          1593.75 ml  Output              550 ml  Net          1043.75 ml   Filed Weights   05/09/17 1341 05/09/17 2040 05/10/17 2100  Weight: 71.2 kg (157 lb) 62.4 kg (137 lb 9.6 oz) 62.3 kg (137 lb 5.6 oz)    Examination: General exam: Appears calm and comfortable  Respiratory system: Clear to auscultation. Respiratory effort normal. Cardiovascular system: S1 & S2 heard, RRR. No JVD, murmurs, rubs, gallops or clicks. No pedal edema. Gastrointestinal system: Abdomen is nondistended, soft and nontender. No organomegaly or masses felt. Normal bowel sounds heard. Central nervous system: Alert and oriented. No focal neurological deficits. Extremities: Symmetric 5 x 5 power. Skin: No rashes, lesions or ulcers Psychiatry: Judgement and insight appear normal. Mood & affect appropriate.   Data Reviewed: I have personally reviewed following labs and imaging studies  CBC:  Recent Labs Lab 05/09/17 1346 05/10/17 0728  WBC 5.4 5.5  HGB 10.1* 8.5*  HCT 31.6* 27.3*  MCV 87.8 88.9  PLT 347 335   Basic Metabolic Panel:  Recent Labs Lab 05/09/17 1346 05/10/17 0728  NA 148* 144  K 3.9 3.5  CL 105 110  CO2 31 27  GLUCOSE 112* 78  BUN 17 14  CREATININE 4.06* 3.45*  CALCIUM 10.4* 8.8*   GFR: Estimated Creatinine Clearance: 17 mL/min (A) (by C-G formula based on SCr of 3.45 mg/dL (H)). Liver Function  Tests:  Recent Labs Lab 05/09/17 1346 05/10/17 0728  AST 30 21  ALT 26 20  ALKPHOS 25* 19*  BILITOT 0.4 0.3  PROT 6.1* 4.7*  ALBUMIN 3.1* 2.4*    Recent Labs Lab 05/09/17 1346  LIPASE 45   No results for input(s): AMMONIA in the last 168 hours. Coagulation Profile: No results for input(s): INR, PROTIME in the last 168 hours. Cardiac Enzymes: No results for input(s): CKTOTAL, CKMB, CKMBINDEX, TROPONINI in the last 168 hours. BNP (last 3 results) No results for input(s): PROBNP in the last 8760 hours. HbA1C: No results for input(s): HGBA1C in the last 72 hours. CBG:  Recent Labs Lab 05/10/17 0726 05/10/17 1114 05/10/17 1619 05/10/17 2136 05/11/17 0739  GLUCAP 72 77 114* 113* 90   Lipid Profile: No results for input(s): CHOL, HDL, LDLCALC, TRIG, CHOLHDL, LDLDIRECT in the last 72 hours. Thyroid Function Tests:  Recent Labs  05/10/17 0728  TSH 1.746   Anemia Panel: No results for input(s): VITAMINB12, FOLATE,  FERRITIN, TIBC, IRON, RETICCTPCT in the last 72 hours. Urine analysis:    Component Value Date/Time   COLORURINE YELLOW 03/26/2017 0420   APPEARANCEUR CLEAR 03/26/2017 0420   LABSPEC 1.020 03/26/2017 0420   PHURINE 6.0 03/26/2017 0420   GLUCOSEU NEGATIVE 03/26/2017 0420   HGBUR MODERATE (A) 03/26/2017 0420   BILIRUBINUR NEGATIVE 03/26/2017 0420   KETONESUR 15 (A) 03/26/2017 0420   PROTEINUR >300 (A) 03/26/2017 0420   NITRITE NEGATIVE 03/26/2017 0420   LEUKOCYTESUR NEGATIVE 03/26/2017 0420   Sepsis Labs: @LABRCNTIP (procalcitonin:4,lacticidven:4)  )No results found for this or any previous visit (from the past 240 hour(s)).   Invalid input(s): PROCALCITONIN, LACTICACIDVEN   Radiology Studies: Ct Abdomen Pelvis Wo Contrast  Result Date: 05/10/2017 CLINICAL DATA:  46 y/o  F; severe abdominal pain. EXAM: CT ABDOMEN AND PELVIS WITHOUT CONTRAST TECHNIQUE: Multidetector CT imaging of the abdomen and pelvis was performed following the standard  protocol without IV contrast. COMPARISON:  03/26/2017 CT abdomen and pelvis FINDINGS: Lower chest: No acute abnormality. Hepatobiliary: No focal liver abnormality is seen. No gallstones, gallbladder wall thickening, or biliary dilatation. Pancreas: Unremarkable. Spleen: Normal in size without focal abnormality. Adrenals/Urinary Tract: Adrenal glands are unremarkable. Kidneys are normal, without renal calculi, focal lesion, or hydronephrosis. Bladder is unremarkable. Stomach/Bowel: Stomach is within normal limits. Appendix appears normal. No evidence of bowel wall thickening, distention, or inflammatory changes. Vascular/Lymphatic: Aortic atherosclerosis. No enlarged abdominal or pelvic lymph nodes. Reproductive: Uterus and bilateral adnexa are unremarkable. IUD well seated with the uterine fundus. Other: No abdominal wall hernia or abnormality. No abdominopelvic ascites. Musculoskeletal: No acute or significant osseous findings. IMPRESSION: No acute process identified as explanation for pain. No acute peripancreatic collection. Electronically Signed   By: Mitzi Hansen M.D.   On: 05/10/2017 01:39     Scheduled Meds: . amLODipine  10 mg Oral Daily  . feeding supplement  1 Container Oral TID BM  . heparin  5,000 Units Subcutaneous Q8H  . insulin aspart  0-9 Units Subcutaneous TID WC  . metoCLOPramide (REGLAN) injection  10 mg Intravenous Q8H  . metoprolol tartrate  25 mg Oral BID  . pantoprazole  40 mg Oral BID  . Vitamin D (Ergocalciferol)  50,000 Units Oral Q Wed   Continuous Infusions: . sodium chloride 0.9 % 1,000 mL with potassium chloride 10 mEq infusion 75 mL/hr at 05/11/17 0322     LOS: 0 days    Time spent: 35 minutes    Clint Lipps, MD [facility] Pager [phone]  If 7PM-7AM, please contact night-coverage [email or web address] Password [email or web address] 05/11/2017, 11:03 AM

## 2017-05-12 DIAGNOSIS — N189 Chronic kidney disease, unspecified: Secondary | ICD-10-CM

## 2017-05-12 DIAGNOSIS — I10 Essential (primary) hypertension: Secondary | ICD-10-CM

## 2017-05-12 DIAGNOSIS — E860 Dehydration: Secondary | ICD-10-CM

## 2017-05-12 DIAGNOSIS — N179 Acute kidney failure, unspecified: Secondary | ICD-10-CM

## 2017-05-12 DIAGNOSIS — K3184 Gastroparesis: Secondary | ICD-10-CM

## 2017-05-12 DIAGNOSIS — E1143 Type 2 diabetes mellitus with diabetic autonomic (poly)neuropathy: Principal | ICD-10-CM

## 2017-05-12 DIAGNOSIS — D638 Anemia in other chronic diseases classified elsewhere: Secondary | ICD-10-CM

## 2017-05-12 LAB — CBC
HEMATOCRIT: 27.9 % — AB (ref 36.0–46.0)
Hemoglobin: 9.1 g/dL — ABNORMAL LOW (ref 12.0–15.0)
MCH: 28.2 pg (ref 26.0–34.0)
MCHC: 32.6 g/dL (ref 30.0–36.0)
MCV: 86.4 fL (ref 78.0–100.0)
Platelets: 346 10*3/uL (ref 150–400)
RBC: 3.23 MIL/uL — ABNORMAL LOW (ref 3.87–5.11)
RDW: 14.3 % (ref 11.5–15.5)
WBC: 5.1 10*3/uL (ref 4.0–10.5)

## 2017-05-12 LAB — GLUCOSE, CAPILLARY
Glucose-Capillary: 97 mg/dL (ref 65–99)
Glucose-Capillary: 129 mg/dL — ABNORMAL HIGH (ref 65–99)
Glucose-Capillary: 141 mg/dL — ABNORMAL HIGH (ref 65–99)
Glucose-Capillary: 122 mg/dL — ABNORMAL HIGH (ref 65–99)

## 2017-05-12 LAB — BASIC METABOLIC PANEL
Anion gap: 8 (ref 5–15)
BUN: 7 mg/dL (ref 6–20)
CO2: 22 mmol/L (ref 22–32)
CREATININE: 3.11 mg/dL — AB (ref 0.44–1.00)
Calcium: 9.2 mg/dL (ref 8.9–10.3)
Chloride: 114 mmol/L — ABNORMAL HIGH (ref 101–111)
GFR calc Af Amer: 20 mL/min — ABNORMAL LOW (ref >60)
GFR, EST NON AFRICAN AMERICAN: 17 mL/min — AB (ref >60)
GLUCOSE: 99 mg/dL (ref 65–99)
POTASSIUM: 3.7 mmol/L (ref 3.5–5.1)
Sodium: 144 mmol/L (ref 135–145)

## 2017-05-12 MED ORDER — SODIUM CHLORIDE 0.45 % IV SOLN
INTRAVENOUS | Status: AC
  Administered 2017-05-12 – 2017-05-13 (×2): via INTRAVENOUS

## 2017-05-12 MED ORDER — METOCLOPRAMIDE HCL 10 MG PO TABS
10.0000 mg | ORAL_TABLET | Freq: Three times a day (TID) | ORAL | Status: DC
  Administered 2017-05-12 – 2017-05-13 (×4): 10 mg via ORAL
  Filled 2017-05-12 (×4): qty 1

## 2017-05-12 NOTE — Care Management Note (Signed)
Case Management Note  Patient Details  Name: Pamela Goodwin MRN: [id number] Date of Birth: Jul 25, 1971  Subjective/Objective:       CM following for progression and d/c planning.      Pt lives with father and brother.         Action/Plan: 05/12/2017 Met with pt on 05/10/2017 , will follow for d/c needs.   Expected Discharge Date:                  Expected Discharge Plan:  [place]  In-House Referral:     Discharge planning Services  CM Consult  Post Acute Care Choice:  NA Choice offered to:  Parent  DME Arranged:  N/A DME Agency:  NA  HH Arranged:    [place] Agency:     Status of Service:  In process, will continue to follow  If discussed at Long Length of Stay Meetings, dates discussed:    Additional Comments:  Adron Bene, RN 05/12/2017, 3:18 PM

## 2017-05-12 NOTE — Progress Notes (Signed)
Patient still having poor appetite and nausea.  The patient has slept most of the day today and yesterday.  Patient consistently needs to be awakened for med admin, meals, assessments and just basic interaction.

## 2017-05-12 NOTE — Progress Notes (Signed)
Physical Therapy Treatment Patient Details Name: Pamela Goodwin MRN: [id number] DOB: Mar 11, 1971 Today's Date: 05/12/2017    History of Present Illness Pamela Goodwin is a 46 y.o. female with medical history significant of HTN, Diabetes Mellitus Type 2 with Gastroparesis, Hx of Pancreatitis, Developmental delay, Renal Disease  (being worked up with Nephrology as an outpatient); Admitted with Nausea and vomiting    PT Comments    Pt continues to show improvements with mobility. Anticipate pt will be at or very close to baseline at DC   Follow Up Recommendations  No PT follow up;Supervision/Assistance - 24 hour     Equipment Recommendations  None recommended by PT    Recommendations for Other Services       Precautions / Restrictions Precautions Precautions: Fall Restrictions Weight Bearing Restrictions: No    Mobility  Bed Mobility Overal bed mobility: Modified Independent             General bed mobility comments: incr time  Transfers Overall transfer level: Needs assistance Equipment used: None Transfers: Sit to/from Stand Sit to Stand: Min guard         General transfer comment: Assist for safety. No dizziness reported  Ambulation/Gait Ambulation/Gait assistance: Min guard;Supervision Ambulation Distance (Feet): 275 Feet Assistive device: None Gait Pattern/deviations: Step-through pattern;Decreased step length - right;Decreased step length - left;Wide base of support Gait velocity: decr Gait velocity interpretation: Below normal speed for age/gender General Gait Details: Assist for safety. Initially min guard but progressed to supervision. Pt with tendency to touch objects on her left for reassurance.   Stairs            Wheelchair Mobility    Modified Rankin (Stroke Patients Only)       Balance Overall balance assessment: Needs assistance Sitting-balance support: No upper extremity supported;Feet supported Sitting balance-Leahy Scale: Good      Standing balance support: No upper extremity supported;During functional activity Standing balance-Leahy Scale: Fair                              Cognition Arousal/Alertness: Awake/alert Behavior During Therapy: WFL for tasks assessed/performed Overall Cognitive Status: History of cognitive impairments - at baseline                                        Exercises      General Comments        Pertinent Vitals/Pain      Home Living                      Prior Function            PT Goals (current goals can now be found in the care plan section) Progress towards PT goals: Progressing toward goals    Frequency    Min 3X/week      PT Plan Current plan remains appropriate    Co-evaluation              AM-PAC PT "6 Clicks" Daily Activity  Outcome Measure  Difficulty turning over in bed (including adjusting bedclothes, sheets and blankets)?: None Difficulty moving from lying on back to sitting on the side of the bed? : None Difficulty sitting down on and standing up from a chair with arms (e.g., wheelchair, bedside commode, etc,.)?: A Little Help needed moving to and from a bed  to chair (including a wheelchair)?: None Help needed walking in hospital room?: A Little Help needed climbing 3-5 steps with a railing? : A Little 6 Click Score: 21    End of Session Equipment Utilized During Treatment: Gait belt Activity Tolerance: Patient tolerated treatment well Patient left: with call bell/phone within reach;in bed;with bed alarm set Nurse Communication: Mobility status PT Visit Diagnosis: Unsteadiness on feet (R26.81)     Time: [id number] PT Time Calculation (min) (ACUTE ONLY): 8 min  Charges:  $Gait Training: 8-22 mins                    G Codes:       [facility] Madysyn Hanken PT [phone]    Angelina [facility] 05/12/2017, 4:50 PM

## 2017-05-12 NOTE — Progress Notes (Addendum)
PROGRESS NOTE  Pamela Goodwin  ZOX:[id number] DOB: [birth year] DOA: 05/09/2017 PCP: [name], MD   Brief Narrative:  Pamela Goodwin is a 46 y.o. female with medical history significant of HTN, Diabetes Mellitus Type 2 with Gastroparesis, Pancreatitis, CKD currently being worked up by Nephrology as an outpatient, suspected developmental delay, who presented to [facility] ED with complaints of poor oral intake, abdominal pain, intractable nausea and nonbloody emesis following a dinner with tacos on night prior to admission and weakness. She was admitted for suspected diabetic gastroparesis versus acute viral GE/food poisoning, dehydration, acute on stage IV chronic kidney disease. Improving. Advancing diet. Possible discharge home 05/13/17.  Assessment & Plan:   Active Problems:   Acute renal failure (ARF) (HCC)   Intractable nausea and vomiting   Acute kidney injury superimposed on chronic kidney disease (HCC)   Abdominal pain   Hypercalcemia   GERD (gastroesophageal reflux disease)   Anemia of chronic disease   Controlled diabetes mellitus (HCC)   Hypernatremia   Diabetic gastroparesis (HCC)   Essential hypertension   Intractable Nausea and Vomiting in the setting of Diabetic Gastroparesis - Nuclear medicine gastric emptying scan on 03/30/17 showed delayed gastric emptying. Only 26% emptying at 4 hours. - CT abdomen and pelvis without contrast 9/4: No acute process identified. No acute peripancreatic collection and pancreas appeared unremarkable. Lipase normal. - DD: Diabetic gastroparesis, acute viral GE, food poisoning. - Treating supportively with bowel rest, IV fluids, Reglan, antiemetics and gradually advancing diet. - Has tolerated clear liquids. Advance to full liquids followed by soft diet as tolerated.  Recent Intermittent Epigastric Abdominal Pain - Workup as indicated above. Likely related to diabetic gastroparesis. Abdominal pain resolved.  AKI on CKD Stage IV - Last  creatinine on 7/26:3.01? Her baseline. - Presented with creatinine of 4.06. Acute kidney injury due to dehydration from poor oral intake and GI losses. - Hydrated with IV fluids. Creatinine has steadily improved to 3.11 and approaching baseline. - Follow BMP in a.m. and with nephrology as outpatient.  Hypernatremia - Likely related to dehydration. Resolved. Encourage oral fluids. Gentle IV fluids for additional 24 hours.  Hypercalcemia  -Patient's initial calcium level elevated at 10.4.  - Resolved.  Diabetes Mellitus Type 2  -Last HbA1c was 6.4 on 03/11/17 -Holding Amaryl and likley need to D/C at Discharge given risk of Hypoglycemia -Placed on Sensitive Novolog SSI. Well-controlled CBGs in the hospital. -Continue to Monitor CBG's  Hypertension -C/w Metoprolol 25 mg po BID and Amlodipine 10 mg po Daily. Mildly uncontrolled at times. Continue for now without change.  Generalized Weakness -Likely from [facility] and poor po Intake -PT/OT have evaluated and recommend no PT or OT follow-up.  GERD -C/w Pantoprazole 40 mg po BID  Normocytic Anemia likely of Chronic Disease - Hemoglobin stable in the 9 g per DL range. Outpatient follow-up with periodic CBCs.    DVT prophylaxis: Heparin Code Status: Full Code Family Communication: None at bedside Disposition Plan: DC home possibly 9/7 pending tolerating advanced diet.  Consultants:   None  Procedures:   None  Antimicrobials:   None   Subjective: Mild occasional nausea overnight. Tolerating clear liquids without vomiting or abdominal pain. Reports no BM since admission. Passing flatus. As per RN, no acute issues reported.  Objective: Vitals:   05/11/17 2151 05/12/17 0524 05/12/17 0754 05/12/17 1150  BP: (!) 159/99 (!) 154/82 (!) 161/107 (!) 152/84  Pulse: (!) 108 99 (!) 107 (!) 102  Resp: Temp: 98.1 F (  36.7 C) 98.9 F (37.2 C) 98.6 F (37 C)   TempSrc: Oral Oral Oral   SpO2: 97% 98%    Weight:  62.2 kg (137 lb 2 oz)     Height:        Intake/Output Summary (Last 24 hours) at 05/12/17 1317 Last data filed at 05/12/17 1254  Gross per 24 hour  Intake          1116.25 ml  Output              450 ml  Net           666.25 ml   Filed Weights   05/09/17 2040 05/10/17 2100 05/11/17 2151  Weight: 62.4 kg (137 lb 9.6 oz) 62.3 kg (137 lb 5.6 oz) 62.2 kg (137 lb 2 oz)    Examination: General exam: Pleasant young female, moderately built and nourished lying comfortably supine in bed. Respiratory system: Clear to auscultation. Respiratory effort normal. Stable. Cardiovascular system: S1 & S2 heard, RRR. No JVD, murmurs, rubs, gallops or clicks. Trace bilateral ankle/lower leg edema. Telemetry personally reviewed: Sinus rhythm, mild occasional sinus tachycardia in the low 100s. Gastrointestinal system: Abdomen is nondistended, soft and nontender. No organomegaly or masses felt. Normal bowel sounds heard. Stable. Central nervous system: Alert and oriented. No focal neurological deficits. Stable. Extremities: Symmetric 5 x 5 power. Skin: No rashes, lesions or ulcers Psychiatry: Judgement and insight may be somewhat impaired. Mood & affect flat.   Data Reviewed: I have personally reviewed following labs and imaging studies  CBC:  Recent Labs Lab 05/09/17 1346 05/10/17 0728 05/12/17 0344  WBC 5.4 5.5 5.1  HGB 10.1* 8.5* 9.1*  HCT 31.6* 27.3* 27.9*  MCV 87.8 88.9 86.4  PLT 347 335 346   Basic Metabolic Panel:  Recent Labs Lab 05/09/17 1346 05/10/17 0728 05/12/17 0344  NA 148* 144 144  K 3.9 3.5 3.7  CL 105 110 114*  CO2 31 27 22   GLUCOSE 112* 78 99  BUN 17 14 7   CREATININE 4.06* 3.45* 3.11*  CALCIUM 10.4* 8.8* 9.2   GFR: Estimated Creatinine Clearance: 18.9 mL/min (A) (by C-G formula based on SCr of 3.11 mg/dL (H)). Liver Function Tests:  Recent Labs Lab 05/09/17 1346 05/10/17 0728  AST 30 21  ALT 26 20  ALKPHOS 25* 19*  BILITOT 0.4 0.3  PROT 6.1* 4.7*    ALBUMIN 3.1* 2.4*    Recent Labs Lab 05/09/17 1346  LIPASE 45   No results for input(s): AMMONIA in the last 168 hours. Coagulation Profile: No results for input(s): INR, PROTIME in the last 168 hours. Cardiac Enzymes: No results for input(s): CKTOTAL, CKMB, CKMBINDEX, TROPONINI in the last 168 hours. BNP (last 3 results) No results for input(s): PROBNP in the last 8760 hours. HbA1C: No results for input(s): HGBA1C in the last 72 hours. CBG:  Recent Labs Lab 05/11/17 1122 05/11/17 1704 05/11/17 2149 05/12/17 0721 05/12/17 1144  GLUCAP 123* 136* 110* 97 129*   Lipid Profile: No results for input(s): CHOL, HDL, LDLCALC, TRIG, CHOLHDL, LDLDIRECT in the last 72 hours. Thyroid Function Tests:  Recent Labs  05/10/17 0728  TSH 1.746   Anemia Panel: No results for input(s): VITAMINB12, FOLATE, FERRITIN, TIBC, IRON, RETICCTPCT in the last 72 hours. Urine analysis:    Component Value Date/Time   COLORURINE YELLOW 03/26/2017 0420   APPEARANCEUR CLEAR 03/26/2017 0420   LABSPEC 1.020 03/26/2017 0420   PHURINE 6.0 03/26/2017 0420   GLUCOSEU NEGATIVE 03/26/2017 0420  HGBUR MODERATE (A) 03/26/2017 0420   BILIRUBINUR NEGATIVE 03/26/2017 0420   KETONESUR 15 (A) 03/26/2017 0420   PROTEINUR >300 (A) 03/26/2017 0420   NITRITE NEGATIVE 03/26/2017 0420   LEUKOCYTESUR NEGATIVE 03/26/2017 0420     Radiology Studies: No results found.   Scheduled Meds: . amLODipine  10 mg Oral Daily  . feeding supplement  1 Container Oral TID BM  . heparin  5,000 Units Subcutaneous Q8H  . insulin aspart  0-9 Units Subcutaneous TID WC  . metoCLOPramide  10 mg Oral TID AC  . metoprolol tartrate  25 mg Oral BID  . pantoprazole  40 mg Oral BID  . Vitamin D (Ergocalciferol)  50,000 Units Oral Q Wed   Continuous Infusions: . sodium chloride 0.9 % 1,000 mL with potassium chloride 10 mEq infusion 75 mL/hr at 05/12/17 0856     LOS: 1 day    Time spent: 35 minutes   Sahir Tolson, MD,  FACP, FHM. [facility] Pager [phone]  If 7PM-7AM, please contact night-coverage [email or web address] Password [email or web address] 05/12/2017, 1:32 PM

## 2017-05-13 DIAGNOSIS — E870 Hyperosmolality and hypernatremia: Secondary | ICD-10-CM

## 2017-05-13 LAB — BASIC METABOLIC PANEL
Anion gap: 8 (ref 5–15)
BUN: 6 mg/dL (ref 6–20)
CO2: 22 mmol/L (ref 22–32)
Calcium: 9.2 mg/dL (ref 8.9–10.3)
Chloride: 115 mmol/L — ABNORMAL HIGH (ref 101–111)
Creatinine, Ser: 3.41 mg/dL — ABNORMAL HIGH (ref 0.44–1.00)
GFR calc Af Amer: 18 mL/min — ABNORMAL LOW (ref >60)
GFR, EST NON AFRICAN AMERICAN: 15 mL/min — AB (ref >60)
GLUCOSE: 104 mg/dL — AB (ref 65–99)
Potassium: 3.6 mmol/L (ref 3.5–5.1)
Sodium: 145 mmol/L (ref 135–145)

## 2017-05-13 LAB — GLUCOSE, CAPILLARY
Glucose-Capillary: 100 mg/dL — ABNORMAL HIGH (ref 65–99)
Glucose-Capillary: 144 mg/dL — ABNORMAL HIGH (ref 65–99)
Glucose-Capillary: 139 mg/dL — ABNORMAL HIGH (ref 65–99)

## 2017-05-13 MED ORDER — METOCLOPRAMIDE HCL 10 MG PO TABS: 10.0000 mg | ORAL_TABLET | Freq: Three times a day (TID) | ORAL | 0 refills | Status: DC | PRN

## 2017-05-13 MED ORDER — ACETAMINOPHEN 325 MG PO TABS: 650.0000 mg | ORAL_TABLET | Freq: Four times a day (QID) | ORAL | Status: DC | PRN

## 2017-05-13 NOTE — Consult Note (Addendum)
HPI: I was asked by Dr. Bethann Berkshire to see Pamela Goodwin who is a 46 y.o. female with known CKD admitted with gastroparesis.  SHe has longstanding diabetes mellitus.  She was seen by renal in July 2018 and on 7/6 creat was 3.64m/dl, renal UKorearevealed a 9.0cm R and 9.5cm L with increased echogenicity. UA has shown protein.  She has seen Dr. BHinda Lenisin the past and is in process of doing a 24hr urin collection. She was admitted on this occasion with gastroparesis and creat 4.06, falling to 3.11 and now 3.41.  Creat was 3.10 on 03/31/17.  I was asked to eval prior to discharge.   Past Medical History:  Diagnosis Date  . Diabetes mellitus without complication ([place]   . Hypertension    History reviewed. No pertinent surgical history. Social History:  reports that she has never smoked. She has never used smokeless tobacco. She reports that she does not drink alcohol or use drugs. Allergies:  Allergies  Allergen Reactions  . Shellfish Allergy Anaphylaxis    Angioedema also  . Hydralazine Other (See Comments)    Patient "zoned out" and caused extreme lethargy (??)   Family History  Problem Relation Age of Onset  . Other Mother        Glioblastoma, deceased    Medications:  Scheduled: . amLODipine  10 mg Oral Daily  . feeding supplement  1 Container Oral TID BM  . heparin  5,000 Units Subcutaneous Q8H  . insulin aspart  0-9 Units Subcutaneous TID WC  . metoCLOPramide  10 mg Oral TID AC  . metoprolol tartrate  25 mg Oral BID  . pantoprazole  40 mg Oral BID  . Vitamin D (Ergocalciferol)  50,000 Units Oral Q Wed    ROS: as per HPI Blood pressure (!) 168/88, pulse (!) 105, temperature 98.2 F (36.8 C), temperature source Oral, resp. rate 20, height 5' 3"  (1.6 m), weight 65.3 kg (144 lb), SpO2 98 %.  General appearance: alert and cooperative Head: Normocephalic, without obvious abnormality, atraumatic Resp: clear to auscultation bilaterally Chest wall: no tenderness Cardio: regular rate  and rhythm, S1, S2 normal, no murmur, click, rub or gallop GI: soft, non-tender; bowel sounds normal; no masses,  no organomegaly Extremities: edema tr Skin: Skin color, texture, turgor normal. No rashes or lesions Neurologic: Grossly normal Results for orders placed or performed during the hospital encounter of 05/09/17 (from the past 48 hour(s))  Glucose, capillary     Status: Abnormal   Collection Time: 05/11/17  5:04 PM  Result Value Ref Range   Glucose-Capillary 136 (H) 65 - 99 mg/dL  Glucose, capillary     Status: Abnormal   Collection Time: 05/11/17  9:49 PM  Result Value Ref Range   Glucose-Capillary 110 (H) 65 - 99 mg/dL  Basic metabolic panel     Status: Abnormal   Collection Time: 05/12/17  3:44 AM  Result Value Ref Range   Sodium 144 135 - 145 mmol/L   Potassium 3.7 3.5 - 5.1 mmol/L   Chloride 114 (H) 101 - 111 mmol/L   CO2 22 22 - 32 mmol/L   Glucose, Bld 99 65 - 99 mg/dL   BUN 7 6 - 20 mg/dL   Creatinine, Ser 3.11 (H) 0.44 - 1.00 mg/dL   Calcium 9.2 8.9 - 10.3 mg/dL   GFR calc non Af Amer 17 (L) >60 mL/min   GFR calc Af Amer 20 (L) >60 mL/min    Comment: (NOTE) The eGFR has been  calculated using the CKD EPI equation. This calculation has not been validated in all clinical situations. eGFR's persistently <60 mL/min signify possible Chronic Kidney Disease.    Anion gap 8 5 - 15  CBC     Status: Abnormal   Collection Time: 05/12/17  3:44 AM  Result Value Ref Range   WBC 5.1 4.0 - 10.5 K/uL   RBC 3.23 (L) 3.87 - 5.11 MIL/uL   Hemoglobin 9.1 (L) 12.0 - 15.0 g/dL   HCT 27.9 (L) 36.0 - 46.0 %   MCV 86.4 78.0 - 100.0 fL   MCH 28.2 26.0 - 34.0 pg   MCHC 32.6 30.0 - 36.0 g/dL   RDW 14.3 11.5 - 15.5 %   Platelets 346 150 - 400 K/uL  Glucose, capillary     Status: None   Collection Time: 05/12/17  7:21 AM  Result Value Ref Range   Glucose-Capillary 97 65 - 99 mg/dL  Glucose, capillary     Status: Abnormal   Collection Time: 05/12/17 11:44 AM  Result Value Ref  Range   Glucose-Capillary 129 (H) 65 - 99 mg/dL  Glucose, capillary     Status: Abnormal   Collection Time: 05/12/17  4:49 PM  Result Value Ref Range   Glucose-Capillary 141 (H) 65 - 99 mg/dL  Glucose, capillary     Status: Abnormal   Collection Time: 05/12/17  9:54 PM  Result Value Ref Range   Glucose-Capillary 122 (H) 65 - 99 mg/dL  Basic metabolic panel     Status: Abnormal   Collection Time: 05/13/17  4:32 AM  Result Value Ref Range   Sodium 145 135 - 145 mmol/L   Potassium 3.6 3.5 - 5.1 mmol/L   Chloride 115 (H) 101 - 111 mmol/L   CO2 22 22 - 32 mmol/L   Glucose, Bld 104 (H) 65 - 99 mg/dL   BUN 6 6 - 20 mg/dL   Creatinine, Ser 3.41 (H) 0.44 - 1.00 mg/dL   Calcium 9.2 8.9 - 10.3 mg/dL   GFR calc non Af Amer 15 (L) >60 mL/min   GFR calc Af Amer 18 (L) >60 mL/min    Comment: (NOTE) The eGFR has been calculated using the CKD EPI equation. This calculation has not been validated in all clinical situations. eGFR's persistently <60 mL/min signify possible Chronic Kidney Disease.    Anion gap 8 5 - 15  Glucose, capillary     Status: Abnormal   Collection Time: 05/13/17  7:30 AM  Result Value Ref Range   Glucose-Capillary 100 (H) 65 - 99 mg/dL  Glucose, capillary     Status: Abnormal   Collection Time: 05/13/17 11:59 AM  Result Value Ref Range   Glucose-Capillary 144 (H) 65 - 99 mg/dL   No results found.  Assessment:  1 CKD 4, of uncertain etiology(prob DM)with w/u in progress by Dr. Hinda Lenis 2 DM with complications Plan: 1 I think she is safe to d/c and f/u with Dr Claudia Pollock C 05/13/2017, 3:20 PM

## 2017-05-13 NOTE — Discharge Summary (Addendum)
Physician Discharge Summary  Pamela Goodwin ZOX:[id number] DOB: 1970-11-17  PCP: [name], MD  Admit date: 05/09/2017 Discharge date: 05/13/2017  Recommendations for Outpatient Follow-up:  1. Dr. [name], PCP in 4 days with repeat labs (CBC & BMP). 2. Dr. Salomon Mast, Nephrology in 1 week.  Home Health: None Equipment/Devices: None    Discharge Condition: Improved and stable  CODE STATUS: Full  Diet recommendation: Heart healthy & diabetic diet.  Discharge Diagnoses:  Active Problems:   Acute renal failure (ARF) (HCC)   Intractable nausea and vomiting   Acute kidney injury superimposed on chronic kidney disease (HCC)   Abdominal pain   Hypercalcemia   GERD (gastroesophageal reflux disease)   Anemia of chronic disease   Controlled diabetes mellitus (HCC)   Hypernatremia   Diabetic gastroparesis (HCC)   Essential hypertension   Brief Summary: Pamela Gravesis a 46 y.o.femalewith medical history significant of HTN, Diabetes Mellitus Type 2 with Gastroparesis, Pancreatitis, CKD currently being worked up by Nephrology as an outpatient, suspected developmental delay, who presented to [facility] ED with complaints of poor oral intake, abdominal pain, intractable nausea and nonbloody emesis following a dinner with tacos on night prior to admission and weakness. She was admitted for suspected diabetic gastroparesis versus acute viral GE/food poisoning, dehydration, acute on stage IV chronic kidney disease.   Assessment & Plan:   Intractable Nausea and Vomiting in the setting of Diabetic Gastroparesis - Nuclear medicine gastric emptying scan on 03/30/17 showed delayed gastric emptying. Only 26% emptying at 4 hours. - CT abdomen and pelvis without contrast 9/4: No acute process identified. No acute peripancreatic collection and pancreas appeared unremarkable. Lipase normal. - DD: Diabetic gastroparesis, acute viral GE, food poisoning. - Treated supportively with bowel rest, IV  fluids, Reglan, antiemetics and gradually advancing diet. - Clinically improved. Has tolerated regular consistency 4. Occasional nausea but no vomiting for couple days. Having normal BMs. No abdominal pain reported. Discussed in detail with patient and father and advised frequent small meals. Also discontinued opioids and Robaxin which may contribute to worsening and not sure if she is really taking these.  Recent Intermittent Epigastric Abdominal Pain - Workup as indicated above. Likely related to diabetic gastroparesis. Abdominal pain resolved.  AKI on CKD Stage IV - Last creatinine on 7/26:3.01? Her baseline. - Presented with creatinine of 4.06. Acute kidney injury due to dehydration from poor oral intake and GI losses. - Hydrated with IV fluids. Creatinine had steadily improved to 3.11 but again increased to 3.4 on day of discharge even though clinically appears euvolemic. - Nephrology was consulted. As per their input, she had been seen by nephrology in July 2018 and on July 6, creatinine was 3.81, renal ultrasound had revealed 9 cm right and 9.5 centimeter left kidney with increased echogenicity. She has seen Dr.Befekadu in the past and is in process of doing a 24-hour urine collection. Nephrology indicates that her stage IV chronic kidney disease of uncertain etiology, probably diabetes with workup in progress by outpatient nephrology and have cleared her for discharge.  Hypernatremia - Likely related to dehydration. Resolved.   Hypercalcemia  -Patient's initial calcium level elevated at 10.4.  - Resolved.  Diabetes Mellitus Type 2 -Last HbA1c was 6.4 on 03/11/17 - Amaryl was held in the hospital and she was placed on NovoLog SSI but has not required even that in the hospital. Suspect that her glycemic control has improved in the context of worsening renal insufficiency. Risk of hypoglycemia with continued Amaryl. Hence discontinued Amaryl  at discharge. Discussed in detail with  patient's father and advised him to continue maintaining CBG logs and follow up with PCP regarding further management.  Hypertension -C/w Metoprolol 25 mg po BID and Amlodipine 10 mg po Daily. Mildly uncontrolled at times. Continue for now without change.  Generalized Weakness -[facility] and poor po Intake. Resolved. -PT/OT have evaluated and recommend no PT or OT follow-up.  GERD -C/w Pantoprazole 40 mg po BID  Normocytic Anemia likely of Chronic Disease - Hemoglobin stable in the 9 g per DL range. Outpatient follow-up with periodic CBCs.  Non-severe (moderate) malnutrition in context of chronic illness - Management per dietitian consultation.   Consultants:   Nephrology  Procedures:   None   Discharge Instructions  Discharge Instructions    Call MD for:  extreme fatigue    Complete by:  As directed    Call MD for:  persistant dizziness or light-headedness    Complete by:  As directed    Call MD for:  persistant nausea and vomiting    Complete by:  As directed    Call MD for:  severe uncontrolled pain    Complete by:  As directed    Diet - low sodium heart healthy    Complete by:  As directed    Diet Carb Modified    Complete by:  As directed    Increase activity slowly    Complete by:  As directed        Medication List    STOP taking these medications   glimepiride 4 MG tablet Commonly known as:  AMARYL   HYDROcodone-acetaminophen 5-325 MG tablet Commonly known as:  NORCO/VICODIN   methocarbamol 500 MG tablet Commonly known as:  ROBAXIN     TAKE these medications   acetaminophen 325 MG tablet Commonly known as:  TYLENOL Take 2 tablets (650 mg total) by mouth every 6 (six) hours as needed for mild pain or moderate pain (or Fever >/= 101).   amLODipine 10 MG tablet Commonly known as:  NORVASC Take 1 tablet (10 mg total) by mouth daily.   FLONASE 50 MCG/ACT nasal spray Generic drug:  fluticasone Place 1 spray into both nostrils  daily as needed for allergies or rhinitis.   levalbuterol 0.63 MG/3ML nebulizer solution Commonly known as:  XOPENEX Take 3 mLs (0.63 mg total) by nebulization every 6 (six) hours as needed for wheezing or shortness of breath.   metoCLOPramide 10 MG tablet Commonly known as:  REGLAN Take 1 tablet (10 mg total) by mouth every 8 (eight) hours as needed for refractory nausea / vomiting. What changed:  reasons to take this   metoprolol tartrate 25 MG tablet Commonly known as:  LOPRESSOR Take 1 tablet (25 mg total) by mouth 2 (two) times daily.   ondansetron 4 MG tablet Commonly known as:  ZOFRAN Take 1 tablet (4 mg total) by mouth every 6 (six) hours as needed for nausea.   pantoprazole 40 MG tablet Commonly known as:  PROTONIX Take 1 tablet (40 mg total) by mouth 2 (two) times daily.   PAZEO 0.7 % Soln Generic drug:  Olopatadine HCl Place 1 drop into both eyes daily as needed (for irritation).   ROBITUSSIN SUGAR FREE 10-100 MG/5ML liquid Generic drug:  Dextromethorphan-Guaifenesin Take 5 mLs by mouth every 12 (twelve) hours as needed (for cough/congestion).   Vitamin D3 50000 units Caps Take 50,000 Units by mouth every Wednesday.      Follow-up Information    Sherryll BurgerShah,  Ashish, MD. Schedule an appointment as soon as possible for a visit in 4 day(s).   Specialty:  Internal Medicine Why:  To be seen with repeat labs (CBC & BMP). Contact information: [address] [place] [address] [phone]        Salomon Mast, MD. Schedule an appointment as soon as possible for a visit in 1 week(s).   Specialty:  Nephrology Contact information: [address] [place] [address] [phone]          Allergies  Allergen Reactions  . Shellfish Allergy Anaphylaxis    Angioedema also  . Hydralazine Other (See Comments)    Patient "zoned out" and caused extreme lethargy (??)      Procedures/Studies: Ct Abdomen Pelvis Wo Contrast  Result Date:  05/10/2017 CLINICAL DATA:  46 y/o  F; severe abdominal pain. EXAM: CT ABDOMEN AND PELVIS WITHOUT CONTRAST TECHNIQUE: Multidetector CT imaging of the abdomen and pelvis was performed following the standard protocol without IV contrast. COMPARISON:  03/26/2017 CT abdomen and pelvis FINDINGS: Lower chest: No acute abnormality. Hepatobiliary: No focal liver abnormality is seen. No gallstones, gallbladder wall thickening, or biliary dilatation. Pancreas: Unremarkable. Spleen: Normal in size without focal abnormality. Adrenals/Urinary Tract: Adrenal glands are unremarkable. Kidneys are normal, without renal calculi, focal lesion, or hydronephrosis. Bladder is unremarkable. Stomach/Bowel: Stomach is within normal limits. Appendix appears normal. No evidence of bowel wall thickening, distention, or inflammatory changes. Vascular/Lymphatic: Aortic atherosclerosis. No enlarged abdominal or pelvic lymph nodes. Reproductive: Uterus and bilateral adnexa are unremarkable. IUD well seated with the uterine fundus. Other: No abdominal wall hernia or abnormality. No abdominopelvic ascites. Musculoskeletal: No acute or significant osseous findings. IMPRESSION: No acute process identified as explanation for pain. No acute peripancreatic collection. Electronically Signed   By: Mitzi Hansen M.D.   On: 05/10/2017 01:39      Subjective: Tolerating regular consistency diet. Occasional nausea. No vomiting for a couple days. No abdominal pain. Had normal BM. Denies dizziness, lightheadedness. As per RN, no acute issues.  Discharge Exam:  Vitals:   05/12/17 1650 05/12/17 2151 05/13/17 0425 05/13/17 1000  BP: (!) 166/97 (!) 148/112 (!) 150/93 (!) 168/88  Pulse: (!) 101 (!) 114 99 (!) 105  Resp: 18 (!) Temp: 97.7 F (36.5 C) 98.3 F (36.8 C) 98 F (36.7 C) 98.2 F (36.8 C)  TempSrc: Oral   Oral  SpO2: 97% 98% 96% 98%  Weight:  65.3 kg (144 lb)    Height:        General exam: Pleasant young female,  moderately built and nourished lying comfortably supine in bed.Oral mucosa moist. Respiratory system: Clear to auscultation. Respiratory effort normal.  Cardiovascular system: S1 & S2 heard, RRR. No JVD, murmurs, rubs, gallops or clicks. Trace bilateral ankle/lower leg edema.  Gastrointestinal system: Abdomen is nondistended, soft and nontender. No organomegaly or masses felt. Normal bowel sounds heard.  Central nervous system: Alert and oriented. No focal neurological deficits.  Extremities: Symmetric 5 x 5 power. Skin: No rashes, lesions or ulcers Psychiatry: Judgement and insight may be somewhat impaired. Mood & affect flat.     The results of significant diagnostics from this hospitalization (including imaging, microbiology, ancillary and laboratory) are listed below for reference.       Labs: CBC:  Recent Labs Lab 05/09/17 1346 05/10/17 0728 05/12/17 0344  WBC 5.4 5.5 5.1  HGB 10.1* 8.5* 9.1*  HCT 31.6* 27.3* 27.9*  MCV 87.8 88.9 86.4  PLT 347 335 346  Basic Metabolic Panel:  Recent Labs Lab 05/09/17 1346 05/10/17 0728 05/12/17 0344 05/13/17 0432  NA 148* 144 144 145  K 3.9 3.5 3.7 3.6  CL 105 110 114* 115*  CO2 GLUCOSE 112* 78 99 104*  BUN CREATININE 4.06* 3.45* 3.11* 3.41*  CALCIUM 10.4* 8.8* 9.2 9.2   Liver Function Tests:  Recent Labs Lab 05/09/17 1346 05/10/17 0728  AST 30 21  ALT 26 20  ALKPHOS 25* 19*  BILITOT 0.4 0.3  PROT 6.1* 4.7*  ALBUMIN 3.1* 2.4*   CBG:  Recent Labs Lab 05/12/17 1649 05/12/17 2154 05/13/17 0730 05/13/17 1159 05/13/17 1620  GLUCAP 141* 122* 100* 144* 139*   Discussed in detail with patient's father. Updated care and answered questions.    Time coordinating discharge: Over 30 minutes  SIGNED:  Marcellus Scott, MD, FACP, FHM. [facility] Pager [phone]  If 7PM-7AM, please contact night-coverage [email or web address] Password [email or web address] 05/13/2017, 4:32 PM

## 2017-05-13 NOTE — Progress Notes (Signed)
Occupational Therapy Treatment Patient Details Name: Pamela Goodwin MRN: [id number] DOB: [birth year] Today's Date: 05/13/2017    History of present illness Pamela Goodwin is a 46 y.o. female with medical history significant of HTN, Diabetes Mellitus Type 2 with Gastroparesis, Hx of Pancreatitis, Developmental delay, Renal Disease  (being worked up with Nephrology as an outpatient); Admitted with Nausea and vomiting   OT comments  Focus of session on ADL training. Pt continues to require min assist for static standing activities (pericare, pulling up clothing) at times. She reports that she sleeps most of the time at home and rarely leaves her home due to her dizziness. Pt reporting fatigue after seated UB bathing, dressing and grooming, but demonstrated ability to complete LB ADL. Will continue to follow.  Follow Up Recommendations  No OT follow up;Supervision/Assistance - 24 hour    Equipment Recommendations  None recommended by OT    Recommendations for Other Services      Precautions / Restrictions Precautions Precautions: Fall       Mobility Bed Mobility Overal bed mobility: Modified Independent                Transfers Overall transfer level: Needs assistance Equipment used: 1 person hand held assist Transfers: Sit to/from Stand Sit to Stand: Min guard         General transfer comment: no dizziness, but reaching for therapist's hand at times    Balance Overall balance assessment: Needs assistance   Sitting balance-Leahy Scale: Good       Standing balance-Leahy Scale: Fair Standing balance comment: static standing                           ADL either performed or assessed with clinical judgement   ADL Overall ADL's : Needs assistance/impaired Eating/Feeding: Independent;Sitting   Grooming: Wash/dry hands;Wash/dry face;Sitting;Applying deodorant;Set up   Upper Body Bathing: Minimal assistance;Sitting   Lower Body Bathing: Minimal  assistance;Sit to/from stand   Upper Body Dressing : Set up;Sitting   Lower Body Dressing: Minimal assistance;Sit to/from stand   Toilet Transfer: Minimal assistance;Ambulation   Toileting- Clothing Manipulation and Hygiene: Minimal assistance;Sit to/from stand       Functional mobility during ADLs: Minimal assistance General ADL Comments: steadying assist in standing during bathing, dressing, pericare     Vision       Perception     Praxis      Cognition Arousal/Alertness: Awake/alert Behavior During Therapy: WFL for tasks assessed/performed Overall Cognitive Status: History of cognitive impairments - at baseline                                          Exercises     Shoulder Instructions       General Comments      Pertinent Vitals/ Pain       Pain Assessment: No/denies pain  Home Living                                          Prior Functioning/Environment              Frequency  Min 2X/week        Progress Toward Goals  OT Goals(current goals can now be found in the care plan section)  Progress towards OT goals: Progressing toward goals  Acute Rehab OT Goals Patient Stated Goal: to go back home OT Goal Formulation: With patient Time For Goal Achievement: 05/25/17 Potential to Achieve Goals: Good  Plan Discharge plan remains appropriate    Co-evaluation                 AM-PAC PT "6 Clicks" Daily Activity     Outcome Measure   Help from another person eating meals?: None Help from another person taking care of personal grooming?: A Little Help from another person toileting, which includes using toliet, bedpan, or urinal?: A Little Help from another person bathing (including washing, rinsing, drying)?: A Little Help from another person to put on and taking off regular upper body clothing?: None Help from another person to put on and taking off regular lower body clothing?: A Little 6 Click  Score: 20    End of Session Equipment Utilized During Treatment: Gait belt  OT Visit Diagnosis: Unsteadiness on feet (R26.81);Muscle weakness (generalized) (M62.81)   Activity Tolerance Patient tolerated treatment well   Patient Left in bed;with call bell/phone within reach   Nurse Communication  (NT aware bath is complete)        Time: [id number] OT Time Calculation (min): 27 min  Charges: OT General Charges $OT Visit: 1 Visit OT Treatments $Self Care/Home Management : 23-37 mins  05/13/2017 Martie RoundJulie Arlington Sigmund, OTR/L Pager: [phone]   Iran PlanasMayberry, [place] BailiffJulie Lynn 05/13/2017, 11:18 AM

## 2017-05-13 NOTE — Discharge Instructions (Signed)

## 2017-05-13 NOTE — Progress Notes (Signed)
Patient discharged to home with father.  IV removed. Discharge instructions reviewed with father. Scripts reviewed with father.  All belongings with patient. Patient left unit in stable condition via wheelchair.  Avelina LaineKimberly Courvoisier Hamblen RN

## 2017-05-17 ENCOUNTER — Ambulatory Visit (HOSPITAL_COMMUNITY): Payer: Medicare Other

## 2017-05-25 ENCOUNTER — Ambulatory Visit (HOSPITAL_COMMUNITY)
Admission: RE | Admit: 2017-05-25 | Discharge: 2017-05-25 | Disposition: A | Discharge status: Home / Self Care | Payer: Medicare Other | Source: Ambulatory Visit | Attending: Pulmonary Disease | Admitting: Pulmonary Disease

## 2017-05-25 DIAGNOSIS — J9811 Atelectasis: Secondary | ICD-10-CM

## 2017-05-25 DIAGNOSIS — I2510 Atherosclerotic heart disease of native coronary artery without angina pectoris: Secondary | ICD-10-CM

## 2017-05-25 DIAGNOSIS — R911 Solitary pulmonary nodule: Secondary | ICD-10-CM

## 2017-05-26 ENCOUNTER — Institutional Professional Consult (permissible substitution): Payer: Medicare Other | Admitting: Pulmonary Disease

## 2017-05-26 ENCOUNTER — Encounter (HOSPITAL_COMMUNITY): Payer: Self-pay | Admitting: Emergency Medicine

## 2017-05-26 ENCOUNTER — Inpatient Hospital Stay (HOSPITAL_COMMUNITY)
Admission: EM | Admit: 2017-05-26 | Discharge: 2017-05-29 | DRG: 074 | Disposition: A | Discharge status: Home / Self Care | Payer: Medicare Other | Attending: Family Medicine | Admitting: Family Medicine

## 2017-05-26 DIAGNOSIS — E860 Dehydration: Secondary | ICD-10-CM | POA: Diagnosis present

## 2017-05-26 DIAGNOSIS — E43 Unspecified severe protein-calorie malnutrition: Secondary | ICD-10-CM | POA: Insufficient documentation

## 2017-05-26 DIAGNOSIS — N184 Chronic kidney disease, stage 4 (severe): Secondary | ICD-10-CM | POA: Diagnosis present

## 2017-05-26 DIAGNOSIS — K3184 Gastroparesis: Secondary | ICD-10-CM | POA: Diagnosis present

## 2017-05-26 DIAGNOSIS — E1143 Type 2 diabetes mellitus with diabetic autonomic (poly)neuropathy: Secondary | ICD-10-CM | POA: Diagnosis not present

## 2017-05-26 DIAGNOSIS — I10 Essential (primary) hypertension: Secondary | ICD-10-CM | POA: Diagnosis present

## 2017-05-26 DIAGNOSIS — R112 Nausea with vomiting, unspecified: Secondary | ICD-10-CM | POA: Diagnosis present

## 2017-05-26 DIAGNOSIS — Z79899 Other long term (current) drug therapy: Secondary | ICD-10-CM

## 2017-05-26 DIAGNOSIS — N189 Chronic kidney disease, unspecified: Secondary | ICD-10-CM

## 2017-05-26 DIAGNOSIS — Z91013 Allergy to seafood: Secondary | ICD-10-CM

## 2017-05-26 DIAGNOSIS — E1122 Type 2 diabetes mellitus with diabetic chronic kidney disease: Secondary | ICD-10-CM | POA: Diagnosis present

## 2017-05-26 DIAGNOSIS — N179 Acute kidney failure, unspecified: Secondary | ICD-10-CM | POA: Diagnosis present

## 2017-05-26 DIAGNOSIS — Z9109 Other allergy status, other than to drugs and biological substances: Secondary | ICD-10-CM

## 2017-05-26 DIAGNOSIS — R6250 Unspecified lack of expected normal physiological development in childhood: Secondary | ICD-10-CM | POA: Diagnosis present

## 2017-05-26 DIAGNOSIS — I129 Hypertensive chronic kidney disease with stage 1 through stage 4 chronic kidney disease, or unspecified chronic kidney disease: Secondary | ICD-10-CM | POA: Diagnosis present

## 2017-05-26 DIAGNOSIS — K219 Gastro-esophageal reflux disease without esophagitis: Secondary | ICD-10-CM | POA: Diagnosis present

## 2017-05-26 DIAGNOSIS — E872 Acidosis: Secondary | ICD-10-CM | POA: Diagnosis present

## 2017-05-26 HISTORY — DX: Gastroparesis: K31.84

## 2017-05-26 HISTORY — DX: Type 2 diabetes mellitus with diabetic autonomic (poly)neuropathy: E11.43

## 2017-05-26 HISTORY — DX: Chronic kidney disease, unspecified: N18.9

## 2017-05-26 HISTORY — DX: Type 2 diabetes mellitus without complications: E11.9

## 2017-05-26 HISTORY — DX: Family history of other specified conditions: Z84.89

## 2017-05-26 HISTORY — DX: Gastro-esophageal reflux disease without esophagitis: K21.9

## 2017-05-26 NOTE — ED Notes (Signed)
Pt's family asking if we can request results from kidney doctor from labs already taken and not draw more blood today.  Will d/c labs at this time and provide info to Diplomatic Services operational officer.

## 2017-05-26 NOTE — ED Triage Notes (Signed)
Pt presents to ED for intractable vomiting and abdominal pain since discharge for the same.  Pt was seen by kidney doctor on Monday and had multiple vials of blood but follow up appointment has not come back.  After speaking to Dr. Clelia Croft, their PCP recommended she come here because "it might be her kidneys".  Family requesting endoscopy and an answer.

## 2017-05-27 ENCOUNTER — Encounter (HOSPITAL_COMMUNITY): Payer: Self-pay | Admitting: Internal Medicine

## 2017-05-27 DIAGNOSIS — G43A1 Cyclical vomiting, intractable: Secondary | ICD-10-CM | POA: Diagnosis not present

## 2017-05-27 DIAGNOSIS — N189 Chronic kidney disease, unspecified: Secondary | ICD-10-CM

## 2017-05-27 DIAGNOSIS — N179 Acute kidney failure, unspecified: Secondary | ICD-10-CM

## 2017-05-27 DIAGNOSIS — K3184 Gastroparesis: Secondary | ICD-10-CM | POA: Diagnosis present

## 2017-05-27 DIAGNOSIS — Z9109 Other allergy status, other than to drugs and biological substances: Secondary | ICD-10-CM | POA: Diagnosis not present

## 2017-05-27 DIAGNOSIS — I10 Essential (primary) hypertension: Secondary | ICD-10-CM | POA: Diagnosis not present

## 2017-05-27 DIAGNOSIS — N184 Chronic kidney disease, stage 4 (severe): Secondary | ICD-10-CM | POA: Diagnosis present

## 2017-05-27 DIAGNOSIS — Z79899 Other long term (current) drug therapy: Secondary | ICD-10-CM | POA: Diagnosis not present

## 2017-05-27 DIAGNOSIS — E43 Unspecified severe protein-calorie malnutrition: Secondary | ICD-10-CM | POA: Diagnosis not present

## 2017-05-27 DIAGNOSIS — R6250 Unspecified lack of expected normal physiological development in childhood: Secondary | ICD-10-CM | POA: Diagnosis present

## 2017-05-27 DIAGNOSIS — E1143 Type 2 diabetes mellitus with diabetic autonomic (poly)neuropathy: Secondary | ICD-10-CM | POA: Diagnosis present

## 2017-05-27 DIAGNOSIS — K219 Gastro-esophageal reflux disease without esophagitis: Secondary | ICD-10-CM | POA: Diagnosis present

## 2017-05-27 DIAGNOSIS — E1122 Type 2 diabetes mellitus with diabetic chronic kidney disease: Secondary | ICD-10-CM | POA: Diagnosis present

## 2017-05-27 DIAGNOSIS — E872 Acidosis: Secondary | ICD-10-CM | POA: Diagnosis present

## 2017-05-27 DIAGNOSIS — E860 Dehydration: Secondary | ICD-10-CM | POA: Diagnosis present

## 2017-05-27 DIAGNOSIS — Z91013 Allergy to seafood: Secondary | ICD-10-CM | POA: Diagnosis not present

## 2017-05-27 DIAGNOSIS — I129 Hypertensive chronic kidney disease with stage 1 through stage 4 chronic kidney disease, or unspecified chronic kidney disease: Secondary | ICD-10-CM | POA: Diagnosis present

## 2017-05-27 LAB — CBC WITH DIFFERENTIAL/PLATELET
BASOS PCT: 1 %
Basophils Absolute: 0 10*3/uL (ref 0.0–0.1)
Eosinophils Absolute: 0.3 10*3/uL (ref 0.0–0.7)
Eosinophils Relative: 6 %
HEMATOCRIT: 30.6 % — AB (ref 36.0–46.0)
HEMOGLOBIN: 9.9 g/dL — AB (ref 12.0–15.0)
Lymphocytes Relative: 35 %
Lymphs Abs: 1.9 10*3/uL (ref 0.7–4.0)
MCH: 27.5 pg (ref 26.0–34.0)
MCHC: 32.4 g/dL (ref 30.0–36.0)
MCV: 85 fL (ref 78.0–100.0)
MONO ABS: 0.7 10*3/uL (ref 0.1–1.0)
MONOS PCT: 12 %
NEUTROS ABS: 2.5 10*3/uL (ref 1.7–7.7)
NEUTROS PCT: 46 %
Platelets: 458 10*3/uL — ABNORMAL HIGH (ref 150–400)
RBC: 3.6 MIL/uL — ABNORMAL LOW (ref 3.87–5.11)
RDW: 14.6 % (ref 11.5–15.5)
WBC: 5.4 10*3/uL (ref 4.0–10.5)

## 2017-05-27 LAB — URINALYSIS, ROUTINE W REFLEX MICROSCOPIC
BACTERIA UA: NONE SEEN
BILIRUBIN URINE: NEGATIVE
GLUCOSE, UA: NEGATIVE mg/dL
HGB URINE DIPSTICK: NEGATIVE
Ketones, ur: 5 mg/dL — AB
LEUKOCYTES UA: NEGATIVE
NITRITE: NEGATIVE
Protein, ur: >=300 mg/dL — AB
SPECIFIC GRAVITY, URINE: 1.019 (ref 1.005–1.030)
pH: 6 (ref 5.0–8.0)

## 2017-05-27 LAB — BASIC METABOLIC PANEL
ANION GAP: 9 (ref 5–15)
BUN: 21 mg/dL — ABNORMAL HIGH (ref 6–20)
CALCIUM: 9.1 mg/dL (ref 8.9–10.3)
CO2: 28 mmol/L (ref 22–32)
Chloride: 105 mmol/L (ref 101–111)
Creatinine, Ser: 3.8 mg/dL — ABNORMAL HIGH (ref 0.44–1.00)
GFR, EST AFRICAN AMERICAN: 15 mL/min — AB (ref >60)
GFR, EST NON AFRICAN AMERICAN: 13 mL/min — AB (ref >60)
GLUCOSE: 95 mg/dL (ref 65–99)
POTASSIUM: 3.6 mmol/L (ref 3.5–5.1)
SODIUM: 142 mmol/L (ref 135–145)

## 2017-05-27 LAB — COMPREHENSIVE METABOLIC PANEL
ALBUMIN: 3.1 g/dL — AB (ref 3.5–5.0)
ALK PHOS: 24 U/L — AB (ref 38–126)
ALT: 18 U/L (ref 14–54)
ANION GAP: 13 (ref 5–15)
AST: 28 U/L (ref 15–41)
BILIRUBIN TOTAL: 0.6 mg/dL (ref 0.3–1.2)
BUN: 26 mg/dL — ABNORMAL HIGH (ref 6–20)
CALCIUM: 10.8 mg/dL — AB (ref 8.9–10.3)
CO2: 34 mmol/L — ABNORMAL HIGH (ref 22–32)
Chloride: 94 mmol/L — ABNORMAL LOW (ref 101–111)
Creatinine, Ser: 4.76 mg/dL — ABNORMAL HIGH (ref 0.44–1.00)
GFR, EST AFRICAN AMERICAN: 12 mL/min — AB (ref >60)
GFR, EST NON AFRICAN AMERICAN: 10 mL/min — AB (ref >60)
Glucose, Bld: 108 mg/dL — ABNORMAL HIGH (ref 65–99)
POTASSIUM: 3.4 mmol/L — AB (ref 3.5–5.1)
Sodium: 141 mmol/L (ref 135–145)
TOTAL PROTEIN: 6.3 g/dL — AB (ref 6.5–8.1)

## 2017-05-27 LAB — LIPASE, BLOOD: LIPASE: 79 U/L — AB (ref 11–51)

## 2017-05-27 MED ORDER — SODIUM CHLORIDE 0.9 % IV SOLN
INTRAVENOUS | Status: DC
  Administered 2017-05-27 – 2017-05-29 (×6): via INTRAVENOUS

## 2017-05-27 MED ORDER — LABETALOL HCL 5 MG/ML IV SOLN: 5.0000 mg | INTRAVENOUS | Status: DC | PRN

## 2017-05-27 MED ORDER — ACETAMINOPHEN 325 MG PO TABS
650.0000 mg | ORAL_TABLET | Freq: Four times a day (QID) | ORAL | Status: DC | PRN
  Administered 2017-05-27 – 2017-05-29 (×3): 650 mg via ORAL
  Filled 2017-05-27 (×3): qty 2

## 2017-05-27 MED ORDER — ISOSORBIDE MONONITRATE ER 30 MG PO TB24
30.0000 mg | ORAL_TABLET | Freq: Every day | ORAL | Status: DC
  Administered 2017-05-27 – 2017-05-29 (×3): 30 mg via ORAL
  Filled 2017-05-27 (×3): qty 1

## 2017-05-27 MED ORDER — PANTOPRAZOLE SODIUM 40 MG PO TBEC
40.0000 mg | DELAYED_RELEASE_TABLET | Freq: Two times a day (BID) | ORAL | Status: DC
  Administered 2017-05-27 – 2017-05-29 (×5): 40 mg via ORAL
  Filled 2017-05-27 (×5): qty 1

## 2017-05-27 MED ORDER — METOCLOPRAMIDE HCL 5 MG/ML IJ SOLN
5.0000 mg | Freq: Three times a day (TID) | INTRAMUSCULAR | Status: DC
  Administered 2017-05-27 – 2017-05-28 (×5): 5 mg via INTRAVENOUS
  Filled 2017-05-27 (×5): qty 2

## 2017-05-27 MED ORDER — ONDANSETRON HCL 4 MG/2ML IJ SOLN
4.0000 mg | Freq: Four times a day (QID) | INTRAMUSCULAR | Status: DC | PRN
  Administered 2017-05-27 – 2017-05-29 (×4): 4 mg via INTRAVENOUS
  Filled 2017-05-27 (×4): qty 2

## 2017-05-27 MED ORDER — BOOST / RESOURCE BREEZE PO LIQD
1.0000 | Freq: Three times a day (TID) | ORAL | Status: DC
  Administered 2017-05-27 – 2017-05-28 (×2): 1 via ORAL

## 2017-05-27 MED ORDER — ENSURE ENLIVE PO LIQD: 237.0000 mL | Freq: Two times a day (BID) | ORAL | Status: DC

## 2017-05-27 MED ORDER — METOPROLOL TARTRATE 25 MG PO TABS
25.0000 mg | ORAL_TABLET | Freq: Once | ORAL | Status: AC
  Administered 2017-05-27: 25 mg via ORAL
  Filled 2017-05-27: qty 1

## 2017-05-27 MED ORDER — AMLODIPINE BESYLATE 10 MG PO TABS
10.0000 mg | ORAL_TABLET | Freq: Every day | ORAL | Status: DC
  Administered 2017-05-27 – 2017-05-29 (×3): 10 mg via ORAL
  Filled 2017-05-27: qty 2
  Filled 2017-05-27 (×2): qty 1

## 2017-05-27 MED ORDER — HEPARIN SODIUM (PORCINE) 5000 UNIT/ML IJ SOLN
5000.0000 [IU] | Freq: Three times a day (TID) | INTRAMUSCULAR | Status: DC
  Administered 2017-05-27 – 2017-05-29 (×6): 5000 [IU] via SUBCUTANEOUS
  Filled 2017-05-27 (×6): qty 1

## 2017-05-27 MED ORDER — SODIUM CHLORIDE 0.9 % IV BOLUS (SEPSIS)
1000.0000 mL | Freq: Once | INTRAVENOUS | Status: AC
  Administered 2017-05-27: 1000 mL via INTRAVENOUS

## 2017-05-27 MED ORDER — METOPROLOL TARTRATE 25 MG PO TABS
25.0000 mg | ORAL_TABLET | Freq: Two times a day (BID) | ORAL | Status: DC
  Administered 2017-05-27 – 2017-05-29 (×4): 25 mg via ORAL
  Filled 2017-05-27 (×4): qty 1

## 2017-05-27 MED ORDER — ONDANSETRON HCL 4 MG PO TABS
4.0000 mg | ORAL_TABLET | Freq: Four times a day (QID) | ORAL | Status: DC | PRN
  Filled 2017-05-27: qty 1

## 2017-05-27 MED ORDER — ONDANSETRON HCL 4 MG/2ML IJ SOLN
4.0000 mg | Freq: Once | INTRAMUSCULAR | Status: AC
  Administered 2017-05-27: 4 mg via INTRAVENOUS
  Filled 2017-05-27: qty 2

## 2017-05-27 MED ORDER — VITAMIN D (ERGOCALCIFEROL) 1.25 MG (50000 UNIT) PO CAPS: 50000.0000 [IU] | ORAL_CAPSULE | ORAL | Status: DC

## 2017-05-27 MED ORDER — ENOXAPARIN SODIUM 40 MG/0.4ML ~~LOC~~ SOLN: 40.0000 mg | SUBCUTANEOUS | Status: DC

## 2017-05-27 NOTE — H&P (Signed)
History and Physical    Pamela Goodwin WUJ:[id number] DOB: 02-14-1971 DOA: 05/26/2017  PCP: [name], MD  Patient coming from: Home  I have personally briefly reviewed patient's old medical records in [facility]  Chief Complaint: N/V abd pain  HPI: Pamela Goodwin is a 46 y.o. female with medical history significant of developmental delay, DM2, HTN, CKD stage 4, diabetic gastroparesis demonstrated on emptying study in July 2018, 3 admissions since then for same (4th today).  Patient bought in to ED by family with c/o intermittent episodes of vomiting over the past 6 to 7 weeks since time of discharge.  Symptoms are persistent.  Family has been using reglan to treat vomiting, but only PRN (not scheduled).  Patient recently saw nephrology on Monday for lab work.  Each time she is admitted, she also has AKI from the vomiting, which improves with hydration but only to her baseline CKD stage 4 with creat of 3.X.  Family requests gastroenterology evaluation.   ED Course: Creat 4.7, BUN 26, Bicarb 34.  Patient resting comfortably in ER right now.   Review of Systems: As per HPI otherwise 10 point review of systems negative.   Past Medical History:  Diagnosis Date  . Diabetes mellitus without complication (HCC)   . Diabetic gastroparesis (HCC)    Shown on emptying study in July 2018  . Hypertension     History reviewed. No pertinent surgical history.   reports that she has never smoked. She has never used smokeless tobacco. She reports that she does not drink alcohol or use drugs.  Allergies  Allergen Reactions  . Shellfish Allergy Anaphylaxis    Angioedema also  . Hydralazine Other (See Comments)    Patient "zoned out" and caused extreme lethargy (??)    Family History  Problem Relation Age of Onset  . Other Mother        Glioblastoma, deceased     Prior to Admission medications   Medication Sig Start Date End Date Taking? Authorizing Provider  acetaminophen  (TYLENOL) 325 MG tablet Take 2 tablets (650 mg total) by mouth every 6 (six) hours as needed for mild pain or moderate pain (or Fever >/= 101). 05/13/17  Yes Hongalgi, Maximino Greenland, MD  amLODipine (NORVASC) 10 MG tablet Take 1 tablet (10 mg total) by mouth daily. 04/01/17  Yes Mikhail, Nita Sells, DO  Cholecalciferol (VITAMIN D3) 50000 units CAPS Take 50,000 Units by mouth every Wednesday.    Yes [provider]  isosorbide mononitrate (IMDUR) 30 MG 24 hr tablet Take 30 mg by mouth daily.   Yes [provider]  metoCLOPramide (REGLAN) 10 MG tablet Take 1 tablet (10 mg total) by mouth every 8 (eight) hours as needed for refractory nausea / vomiting. 05/13/17 05/13/18 Yes Hongalgi, Maximino Greenland, MD  metoprolol tartrate (LOPRESSOR) 25 MG tablet Take 1 tablet (25 mg total) by mouth 2 (two) times daily. 03/31/17  Yes Mikhail, Maryann, DO  ondansetron (ZOFRAN) 4 MG tablet Take 1 tablet (4 mg total) by mouth every 6 (six) hours as needed for nausea. 03/31/17  Yes Mikhail, Maryann, DO  pantoprazole (PROTONIX) 40 MG tablet Take 1 tablet (40 mg total) by mouth 2 (two) times daily. 03/31/17  Yes Edsel Petrin, DO    Physical Exam: Vitals:   05/27/17 0131 05/27/17 0132 05/27/17 0319 05/27/17 0420  BP: (!) 122/99  (!) 170/102 (!) 162/116  Pulse: (!) 112 (!) 111  (!) 117  Resp: 18     Temp:  TempSrc:      SpO2: 100% 100%      Constitutional: NAD, calm, comfortable Eyes: PERRL, lids and conjunctivae normal ENMT: Mucous membranes are moist. Posterior pharynx clear of any exudate or lesions.Normal dentition.  Neck: normal, supple, no masses, no thyromegaly Respiratory: clear to auscultation bilaterally, no wheezing, no crackles. Normal respiratory effort. No accessory muscle use.  Cardiovascular: Regular rate and rhythm, no murmurs / rubs / gallops. No extremity edema. 2+ pedal pulses. No carotid bruits.  Abdomen: no tenderness, no masses palpated. No hepatosplenomegaly. Bowel sounds positive.    Musculoskeletal: no clubbing / cyanosis. No joint deformity upper and lower extremities. Good ROM, no contractures. Normal muscle tone.  Skin: no rashes, lesions, ulcers. No induration Neurologic: CN 2-12 grossly intact. Sensation intact, DTR normal. Strength 5/5 in all 4.  Psychiatric: Normal judgment and insight. Alert and oriented x 3. Normal mood.    Labs on Admission: I have personally reviewed following labs and imaging studies  CBC:  Recent Labs Lab 05/27/17 0241  WBC 5.4  NEUTROABS 2.5  HGB 9.9*  HCT 30.6*  MCV 85.0  PLT 458*   Basic Metabolic Panel:  Recent Labs Lab 05/27/17 0241  NA 141  K 3.4*  CL 94*  CO2 34*  GLUCOSE 108*  BUN 26*  CREATININE 4.76*  CALCIUM 10.8*   GFR: CrCl cannot be calculated (Unknown ideal weight.). Liver Function Tests:  Recent Labs Lab 05/27/17 0241  AST 28  ALT 18  ALKPHOS 24*  BILITOT 0.6  PROT 6.3*  ALBUMIN 3.1*    Recent Labs Lab 05/27/17 0241  LIPASE 79*   No results for input(s): AMMONIA in the last 168 hours. Coagulation Profile: No results for input(s): INR, PROTIME in the last 168 hours. Cardiac Enzymes: No results for input(s): CKTOTAL, CKMB, CKMBINDEX, TROPONINI in the last 168 hours. BNP (last 3 results) No results for input(s): PROBNP in the last 8760 hours. HbA1C: No results for input(s): HGBA1C in the last 72 hours. CBG: No results for input(s): GLUCAP in the last 168 hours. Lipid Profile: No results for input(s): CHOL, HDL, LDLCALC, TRIG, CHOLHDL, LDLDIRECT in the last 72 hours. Thyroid Function Tests: No results for input(s): TSH, T4TOTAL, FREET4, T3FREE, THYROIDAB in the last 72 hours. Anemia Panel: No results for input(s): VITAMINB12, FOLATE, FERRITIN, TIBC, IRON, RETICCTPCT in the last 72 hours. Urine analysis:    Component Value Date/Time   COLORURINE YELLOW 05/27/2017 0134   APPEARANCEUR HAZY (A) 05/27/2017 0134   LABSPEC 1.019 05/27/2017 0134   PHURINE 6.0 05/27/2017 0134    GLUCOSEU NEGATIVE 05/27/2017 0134   HGBUR NEGATIVE 05/27/2017 0134   BILIRUBINUR NEGATIVE 05/27/2017 0134   KETONESUR 5 (A) 05/27/2017 0134   PROTEINUR >=300 (A) 05/27/2017 0134   NITRITE NEGATIVE 05/27/2017 0134   LEUKOCYTESUR NEGATIVE 05/27/2017 0134    Radiological Exams on Admission: Ct Chest Wo Contrast  Result Date: 05/26/2017 CLINICAL DATA:  Followup pulmonary nodules EXAM: CT CHEST WITHOUT CONTRAST TECHNIQUE: Multidetector CT imaging of the chest was performed following the standard protocol without IV contrast. COMPARISON:  03/11/2017 FINDINGS: Cardiovascular: The heart size appears within normal limits. Lad coronary artery calcifications identified. No pericardial effusion. Mediastinum/Nodes: The trachea appears patent and is midline. Normal appearance of the esophagus. No enlarged mediastinal or hilar lymph nodes. Lungs/Pleura: No airspace opacities identified. No pleural effusion. Scar versus subsegmental atelectasis within the lingula and left lower lobe noted. This is improved when compared with the previous examination. Previously noted lower lung zone predominant pulmonary nodular  densities have resolved in the interval compatible with an inflammatory or infectious process. No new pulmonary nodules or masses identified. Upper Abdomen: No acute abnormality. Musculoskeletal: No chest wall mass or suspicious bone lesions identified. IMPRESSION: 1. Interval resolution of previous pulmonary nodules compatible with an inflammatory or infectious process. 2. Mild residual subsegmental atelectasis/scarring in the lower lung zones. 3. Lad coronary artery calcifications. Electronically Signed   By: Signa Kell M.D.   On: 05/26/2017 10:00    EKG: Independently reviewed.  Assessment/Plan Principal Problem:   Intractable nausea and vomiting Active Problems:   Acute kidney injury superimposed on chronic kidney disease (HCC)   Diabetic gastroparesis (HCC)   Essential hypertension     1. Intractable nausea and vomiting - 1. Most likely gastroparesis given + gastric emptying study in July 1. Odd though that her DM2 was never really known to be severely out of control, she was diagnosed with DM2 only 3 years ago and never had to take meds for this, A1C is only 6.4 in July and this achieved with no medical treatment. 2. Given this, feel that family's request for GI consultation and eval is more than reasonable and indicated 2. Reglan  IV Q8H scheduled (per PharmD dont use the  dose given her renal function) 3. Likely will need to also reduce home dose to  Q8H PO 4. Zofran PRN 2. AKI on CKD stage 4 - 1. Dehydration and metabolic acidosis due to uncontrolled N/V 2. IVF: NS at 125 cc/hr 3. HTN - 1. Continue metoprolol and amlodipine if she can keep these down 2. Labetalol PRN if not  DVT prophylaxis: Heparin [place] Code Status: Full Family Communication: Family at bedside Disposition Plan: Home after admit Consults called: None, Call GI in AM Admission status: Admit to inpatient - intractable N/V and unable to tolerate POs which has sent her kidneys into failure   Hillary Bow DO [facility] Pager [phone]  If 7AM-7PM, please contact day team taking care of patient [email or web address] Password [facility]  05/27/2017, 4:23 AM

## 2017-05-27 NOTE — ED Notes (Signed)
Admitting dr notified of pt's heart rate-- will get EKG, no change in bed request.

## 2017-05-27 NOTE — ED Provider Notes (Signed)
[facility]-EMERGENCY DEPT Provider Note   CSN: [id number] Arrival date & time: 05/26/17  1559     History   Chief Complaint Chief Complaint  Patient presents with  . Emesis    HPI Narcisa Rijo is a 46 y.o. female.  The history is provided by the patient and medical records.    46 y.o. With hx of HTN, DM, GERD, anemia, diabetic gastroparesis, CKD, Presenting to the ED with vomiting.  Patient has developmental delay and unable to provide significant history. Majority of history provided by her father and grandfather. A report patient has had trouble with intermittent episodes of vomiting over the past 6 or 7 weeks. Therefore she has been seen in the ED 3 times and has had several CAT scans at this point and multiple lab past. States she has been admitted twice but they still do not know why she is vomiting.  States her kidney function seems to be rising age ED visit and they were referred to nephrologist. She saw them on Monday and had "10 vials" of lab tests drawn. States she was also sent for a CT of her chest done on 05/25/2017 that was negative. States over the past few days she's not really had anything to eat or drink, they states she drinks a few sips of Glucerna but began vomiting again. She's not had any reported fever or chills. No diarrhea.  Past Medical History:  Diagnosis Date  . Diabetes mellitus without complication (HCC)   . Hypertension     Patient Active Problem List   Diagnosis Date Noted  . GERD (gastroesophageal reflux disease) 05/09/2017  . Anemia of chronic disease 05/09/2017  . Controlled diabetes mellitus (HCC) 05/09/2017  . Hypernatremia 05/09/2017  . Diabetic gastroparesis (HCC) 05/09/2017  . Essential hypertension 05/09/2017  . Intractable nausea and vomiting 03/26/2017  . Acute kidney injury superimposed on chronic kidney disease (HCC) 03/26/2017  . Abdominal pain 03/26/2017  . Hypertensive urgency 03/26/2017  . Leukocytosis 03/26/2017  . Hypercalcemia  03/26/2017  . Acute renal failure (ARF) (HCC) 03/11/2017    History reviewed. No pertinent surgical history.  OB History    No data available       Home Medications    Prior to Admission medications   Medication Sig Start Date End Date Taking? Authorizing Provider  acetaminophen (TYLENOL) 325 MG tablet Take 2 tablets (650 mg total) by mouth every 6 (six) hours as needed for mild pain or moderate pain (or Fever >/= 101). 05/13/17   Hongalgi, Maximino Greenland, MD  amLODipine (NORVASC) 10 MG tablet Take 1 tablet (10 mg total) by mouth daily. 04/01/17   Edsel Petrin, DO  Cholecalciferol (VITAMIN D3) 50000 units CAPS Take 50,000 Units by mouth every Wednesday.     [provider]  Dextromethorphan-Guaifenesin (ROBITUSSIN SUGAR FREE) 10-100 MG/5ML liquid Take 5 mLs by mouth every 12 (twelve) hours as needed (for cough/congestion).    [provider]  fluticasone (FLONASE) 50 MCG/ACT nasal spray Place 1 spray into both nostrils daily as needed for allergies or rhinitis.    [provider]  levalbuterol Pauline Aus) 0.63 MG/3ML nebulizer solution Take 3 mLs (0.63 mg total) by nebulization every 6 (six) hours as needed for wheezing or shortness of breath. 03/15/17   Dorothea Ogle, MD  metoCLOPramide (REGLAN) 10 MG tablet Take 1 tablet (10 mg total) by mouth every 8 (eight) hours as needed for refractory nausea / vomiting. 05/13/17 05/13/18  Hongalgi, Maximino Greenland, MD  metoprolol tartrate (LOPRESSOR) 25 MG  tablet Take 1 tablet (25 mg total) by mouth 2 (two) times daily. 03/31/17   Mikhail, Nita Sells, DO  Olopatadine HCl (PAZEO) 0.7 % SOLN Place 1 drop into both eyes daily as needed (for irritation).    [provider]  ondansetron (ZOFRAN) 4 MG tablet Take 1 tablet (4 mg total) by mouth every 6 (six) hours as needed for nausea. 03/31/17   Mikhail, Nita Sells, DO  pantoprazole (PROTONIX) 40 MG tablet Take 1 tablet (40 mg total) by mouth 2 (two) times daily. 03/31/17   Edsel Petrin, DO     Family History Family History  Problem Relation Age of Onset  . Other Mother        Glioblastoma, deceased    Social History Social History  Substance Use Topics  . Smoking status: Never Smoker  . Smokeless tobacco: Never Used  . Alcohol use No     Allergies   Shellfish allergy and Hydralazine   Review of Systems Review of Systems  Gastrointestinal: Positive for nausea and vomiting.  All other systems reviewed and are negative.    Physical Exam Updated Vital Signs BP (!) 148/109 (BP Location: Right Arm)   Pulse (!) 119   Temp 97.8 F (36.6 C) (Oral)   Resp 18   SpO2 97%   Physical Exam  Constitutional: She is oriented to person, place, and time. She appears well-developed and well-nourished.  HENT:  Head: Normocephalic and atraumatic.  Mouth/Throat: Oropharynx is clear and moist.  Eyes: Pupils are equal, round, and reactive to light. Conjunctivae and EOM are normal.  Neck: Normal range of motion.  Cardiovascular: Normal rate, regular rhythm and normal heart sounds.   Pulmonary/Chest: Effort normal and breath sounds normal.  Abdominal: Soft. Bowel sounds are normal. There is no tenderness. There is no rigidity and no guarding.  Abdomen soft, nondistended, bowel sounds are normal, no focal tenderness  Musculoskeletal: Normal range of motion.  Neurological: She is alert and oriented to person, place, and time.  Skin: Skin is warm and dry.  Psychiatric: She has a normal mood and affect.  Nursing note and vitals reviewed.    ED Treatments / Results  Labs (all labs ordered are listed, but only abnormal results are displayed) Labs Reviewed  CBC WITH DIFFERENTIAL/PLATELET - Abnormal; Notable for the following:       Result Value   RBC 3.60 (*)    Hemoglobin 9.9 (*)    HCT 30.6 (*)    Platelets 458 (*)    All other components within normal limits  COMPREHENSIVE METABOLIC PANEL - Abnormal; Notable for the following:    Potassium 3.4 (*)    Chloride 94  (*)    CO2 34 (*)    Glucose, Bld 108 (*)    BUN 26 (*)    Creatinine, Ser 4.76 (*)    Calcium 10.8 (*)    Total Protein 6.3 (*)    Albumin 3.1 (*)    Alkaline Phosphatase 24 (*)    GFR calc non Af Amer 10 (*)    GFR calc Af Amer 12 (*)    All other components within normal limits  LIPASE, BLOOD - Abnormal; Notable for the following:    Lipase 79 (*)    All other components within normal limits  URINALYSIS, ROUTINE W REFLEX MICROSCOPIC - Abnormal; Notable for the following:    APPearance HAZY (*)    Ketones, ur 5 (*)    Protein, ur >=300 (*)    Squamous Epithelial / LPF  0-5 (*)    All other components within normal limits    EKG  EKG Interpretation None       Radiology  Procedures Procedures (including critical care time)  Medications Ordered in ED Medications  sodium chloride 0.9 % bolus 1,000 mL (not administered)  ondansetron (ZOFRAN) injection 4 mg (not administered)     Initial Impression / Assessment and Plan / ED Course  I have reviewed the triage vital signs and the nursing notes.  Pertinent labs & imaging results that were available during my care of the patient were reviewed by me and considered in my medical decision making (see chart for details).  46 year old female here with nausea and vomiting. Has been for same several times recently. Family reports they do not understand why she keeps vomiting. On chart review she has had a few CT scans as well as a gastric emptying study that showed delayed emptying, only 26% at hour 4. I discussed this with family, they seem very confused by these results and do not know what they mean. They report they were under the impression that her symptoms would resolve once they took her off some of her home medications at last hospitalization. After long discussion in the room with them, they seem to understand better. Patient is afebrile and nontoxic. Her abdomen is overall soft. Family declined work on her arrival around 3  PM yesterday afternoon as she had blood work done earlier in the week. Unfortunately, at this hour I cannot obtain the results from her nephrology office and they are not viewable through Epic. Will repeat labs, start IVF, zofran.  Patient's screening labs with evidence of acute on chronic AKI-- SrCr now up to 4.76 (highest previously from 4.18 on 7/20, most recent was 3.41 on 05/13/17).  BUN is elevated at 26, suspect component of dehydration from her gastroparesis.  Patient has not had any active emesis here, father reports some dry heaving.  At this point, will require repeat admission for rehydration.  Case discussed with hospitalist service, Dr. Julian Reil--- will admit for ongoing care.  Final Clinical Impressions(s) / ED Diagnoses   Final diagnoses:  AKI (acute kidney injury) (HCC)  Dehydration    New Prescriptions New Prescriptions   No medications on file     Garlon Hatchet, PA-C 05/27/17 0424    Ward, Layla Maw, DO 05/27/17 0430

## 2017-05-27 NOTE — Progress Notes (Signed)
Pamela Goodwin [id number] Admission Data: 05/27/2017 4:52 PM Attending Provider: Randel Pigg, Dorma Russell, MD  Goodwin, Ashish, MD Consults/ Treatment Team:   Pamela Goodwin is a 46 y.o. female patient admitted from ED awake, alert  & orientated  X 3,  Full Code, VSS - Blood pressure 113/83, pulse (!) 104, temperature (!) 97.5 F (36.4 C), temperature source Oral, resp. rate 19, height 5' (1.524 m), weight 61 kg (134 lb 8 oz), SpO2 96 %., no c/o shortness of breath, no c/o chest pain, no distress noted.   IV site WDL:  forearm right, condition patent and no redness with a transparent dsg that's clean dry and intact.  Allergies:   Allergies  Allergen Reactions  . Shellfish Allergy Anaphylaxis    Angioedema also  . Hydralazine Other (See Comments)    Patient "zoned out" and caused extreme lethargy (??)     Past Medical History:  Diagnosis Date  . Diabetes mellitus without complication (HCC)   . Diabetic gastroparesis (HCC)    Shown on emptying study in July 2018  . Hypertension     Pt orientation to unit, room and routine. Information packet given to patient/family and safety video watched.  Admission INP armband ID verified with patient/family, and in place. SR up x 2, fall risk assessment complete with Patient and family verbalizing understanding of risks associated with falls. Pt verbalizes an understanding of how to use the call bell and to call for help before getting out of bed.  Skin, clean-dry- intact without evidence of bruising, or skin tears.   No evidence of skin break down noted on exam.   Will cont to monitor and assist as needed.  Pamela Goodwin Pamela Lose, RN 05/27/2017 4:52 PM

## 2017-05-27 NOTE — ED Notes (Signed)
Pamela Goodwin and Pamela Goodwin [phone], [phone]

## 2017-05-27 NOTE — Progress Notes (Signed)
Patient admitted after midnight see H&P for further details  Patient admitted for acute exacerbation of gastroparesis Placed on IV Reglan and nothing by mouth. Patient doing much better no nausea no vomiting will advance diet to clear liquids. No GI consultation at this point indicated  Continue plan per H&P  Latrelle Dodrill MD

## 2017-05-27 NOTE — ED Notes (Signed)
Pt continues to be nauseous.

## 2017-05-28 DIAGNOSIS — E43 Unspecified severe protein-calorie malnutrition: Secondary | ICD-10-CM

## 2017-05-28 LAB — BASIC METABOLIC PANEL
Anion gap: 7 (ref 5–15)
BUN: 18 mg/dL (ref 6–20)
CHLORIDE: 108 mmol/L (ref 101–111)
CO2: 26 mmol/L (ref 22–32)
CREATININE: 3.47 mg/dL — AB (ref 0.44–1.00)
Calcium: 8.5 mg/dL — ABNORMAL LOW (ref 8.9–10.3)
GFR calc non Af Amer: 15 mL/min — ABNORMAL LOW (ref >60)
GFR, EST AFRICAN AMERICAN: 17 mL/min — AB (ref >60)
GLUCOSE: 100 mg/dL — AB (ref 65–99)
Potassium: 3.2 mmol/L — ABNORMAL LOW (ref 3.5–5.1)
Sodium: 141 mmol/L (ref 135–145)

## 2017-05-28 LAB — CBC
HEMATOCRIT: 25.1 % — AB (ref 36.0–46.0)
HEMOGLOBIN: 8.2 g/dL — AB (ref 12.0–15.0)
MCH: 27.9 pg (ref 26.0–34.0)
MCHC: 32.7 g/dL (ref 30.0–36.0)
MCV: 85.4 fL (ref 78.0–100.0)
Platelets: 343 10*3/uL (ref 150–400)
RBC: 2.94 MIL/uL — ABNORMAL LOW (ref 3.87–5.11)
RDW: 14.8 % (ref 11.5–15.5)
WBC: 5.5 10*3/uL (ref 4.0–10.5)

## 2017-05-28 MED ORDER — ADULT MULTIVITAMIN LIQUID CH
15.0000 mL | Freq: Every day | ORAL | Status: DC
  Administered 2017-05-28 – 2017-05-29 (×2): 15 mL via ORAL
  Filled 2017-05-28 (×2): qty 15

## 2017-05-28 MED ORDER — METOCLOPRAMIDE HCL 5 MG/ML IJ SOLN
10.0000 mg | Freq: Three times a day (TID) | INTRAMUSCULAR | Status: DC
  Administered 2017-05-28 – 2017-05-29 (×3): 10 mg via INTRAVENOUS
  Filled 2017-05-28 (×3): qty 2

## 2017-05-28 MED ORDER — PRO-STAT SUGAR FREE PO LIQD
30.0000 mL | Freq: Two times a day (BID) | ORAL | Status: DC
  Administered 2017-05-28 – 2017-05-29 (×3): 30 mL via ORAL
  Filled 2017-05-28 (×2): qty 30

## 2017-05-28 NOTE — Progress Notes (Signed)
Initial Nutrition Assessment  DOCUMENTATION CODES:  Severe malnutrition in context of acute illness/injury  INTERVENTION:   Please note, per objective weight measurements, patient has lost >20% of her BW since when her gastroparesis was diagnosed, less than 3 months ago.    Due to 4 admissions for the same issues and a weight loss of >20% of bw in <3 months, would recommend consideration of a post pyloric tube feeding should be considered.    Potential interventions limited until diet advanced. Will trial Prostat.    Liquid MVI given prolonged period of poor intake   Would recommend reobtaining a1c as last one was nearly 3 months ago.   NUTRITION DIAGNOSIS:  Malnutrition (Severe, Acute) related to nausea, vomiting as evidenced by loss of >20% bw in <3 months and an estimated oral intake that has met </= 50% of needs for >/= 5 days  GOAL:  Patient will meet greater than or equal to 90% of their needs  MONITOR:  PO intake, Supplement acceptance, Diet advancement, Labs, Weight trends, I & O's  REASON FOR ASSESSMENT:   Malnutrition Screening Tool    ASSESSMENT:  46 y/o female PMHx DM2, HTN, CKD4, Significant developmental delay, Diabetic gastroparesis (dx 03/2017). Has had 4 admissions since that time (this is 4th), due to N/V and abdominal pain. Represents with n/v over 6-7 days. Worked up for AKI from vomiting and admitted for management.   Pt is alone in room on RD arrival. She is a poor historian secondary to her developmental delay. She states she is feeling better today. RD asked if she knew her UBW, which she did not. RD asked about what her BGs were at home. She stated her dad managed this and she does not know what they run around.  She is unable to give any detail about how she has been eating recently. RD asked what foods she felt worsened her symptoms. She replied "liquids". When asked what foods she felt she tolerate best, she stated "chicken". Other items that were  mentioned included sandwiches and cheerios with milk. Despite improvement in symptoms, remains on a CL diet. She says she does not like the [employer].   NFPE: Deferred at this time  Per review of her weight measurements, the patient has lost 25-35 lbs since she was first diagnosed with gastroparesis in July. This is a loss of >20% bw in <3 months.   Labs: K:3.2, Creat:3.47 (improved), H/H:8.2/25.1, Albumin: 3.1, BGs 95-110, A1C 7/6 was 6.4 Meds:Ensure, Boost Breeze, Reglan, PPI, Vitamin D, IVF   Recent Labs Lab 05/27/17 0241 05/27/17 1818 05/28/17 0532  NA 141 142 141  K 3.4* 3.6 3.2*  CL 94* 105 108  CO2 34* 28 26  BUN 26* 21* 18  CREATININE 4.76* 3.80* 3.47*  CALCIUM 10.8* 9.1 8.5*  GLUCOSE 108* 95 100*   Diet Order:  Diet clear liquid Room service appropriate? Yes; Fluid consistency: Thin  Skin:  Reviewed, no issues  Last BM:  9/22  Height:  Ht Readings from Last 1 Encounters:  05/27/17 5' (1.524 m)   Weight:  Wt Readings from Last 1 Encounters:  05/28/17 134 lb 7.7 oz (61 kg)   Wt Readings from Last 10 Encounters:  05/28/17 134 lb 7.7 oz (61 kg)  05/12/17 144 lb (65.3 kg)  03/31/17 157 lb 13.6 oz (71.6 kg)  03/13/17 174 lb 2.6 oz (79 kg)   Ideal Body Weight:  45.45 kg  BMI:  Body mass index is 26.26 kg/m.  Estimated  Nutritional Needs:  Kcal:  1500-1700 (25-28 kcal/kg bw) Protein:  73-85g Pro (1.2-1.4 g/kg bw) Fluid:  >1.5 L fluid (25 ml/kg bw)  EDUCATION NEEDS:  No education needs identified at this time  Burtis Junes RD, LDN, [facility] Pager: [phone] 05/28/2017 3:21 PM

## 2017-05-28 NOTE — Progress Notes (Signed)
PROGRESS NOTE [facility]   Pamela Goodwin   ZOX:[id number] DOB: 1971/05/15  DOA: 05/26/2017 PCP: [name], MD   Brief Narrative:  Pamela Goodwin  It 46 year old female with medical history significant of diabetes mellitus type 2, hypertension, chronic kidney disease stage IV, diabetic gastroparesis diagnosed in July 2018. Patient was brought to the ED by family complaining of intermittent vomiting for the past couple of weeks. Patient was admitted for diabetic gastroparesis,   Subjective: Patient seen and examined, feeling mild nausea. Tolerated clear liquids minimally. No emesis. Afebrile. Cr improved.   Assessment & Plan:   Principal Problem:   Intractable nausea and vomiting Active Problems:   Acute kidney injury superimposed on chronic kidney disease (HCC)   Diabetic gastroparesis (HCC)   Essential hypertension   Protein-calorie malnutrition, severe   Acute exacerbation of diabetic gastroparesis  Patient had gastric emptying study shows gastroparesis in December.  Patient was taking Reglan as needed - she will need schedule dose, will increase reglan to 10 mg TID  She was advised to take small frequent meals  Will consider erythromycin  Control DM - her A1c was 6.4 in July - will repeat now  Continue clears for now  Continue IVF   AKI on CKD stage IV Cr on admission 4.76 - baseline Cr ~ 3 Continue IVF for now  Monitor BMP in am   Essential hypertension  BP stable  Continue current management   DVT prophylaxis: Heparin sq Code Status: Full code  Family Communication: Dicussed with father via phone  Disposition Plan: Home next 24-48 hrs   Consultants:   None   Procedures:   None   Antimicrobials: Anti-infectives    None       Objective: Vitals:   05/27/17 2031 05/28/17 0603 05/28/17 1000 05/28/17 1444  BP: (!) 149/98 (!) 142/92 (!) 149/92 131/84  Pulse: (!) 109 100 (!) 102 94  Resp: Temp: 97.9 F (36.6 C) 98.4 F (36.9 C)   98.4 F (36.9 C)  TempSrc: Oral Oral  Oral  SpO2: 97% 96%  97%  Weight:  61 kg (134 lb 7.7 oz)    Height:        Intake/Output Summary (Last 24 hours) at 05/28/17 1614 Last data filed at 05/28/17 1207  Gross per 24 hour  Intake          4094.99 ml  Output              400 ml  Net          3694.99 ml   Filed Weights   05/27/17 1649 05/28/17 0603  Weight: 61 kg (134 lb 8 oz) 61 kg (134 lb 7.7 oz)    Examination:  General: Pt is alert, awake, not in acute distress Cardiovascular: RRR, S1/S2 +, no rubs, no gallops Respiratory: CTA bilaterally, no wheezing, no rhonchi Abdominal: Soft, NT, ND, bowel sounds + Extremities: no edema, no cyanosis   Data Reviewed: I have personally reviewed following labs and imaging studies  CBC:  Recent Labs Lab 05/27/17 0241 05/28/17 0532  WBC 5.4 5.5  NEUTROABS 2.5  --   HGB 9.9* 8.2*  HCT 30.6* 25.1*  MCV 85.0 85.4  PLT 458* 343   Basic Metabolic Panel:  Recent Labs Lab 05/27/17 0241 05/27/17 1818 05/28/17 0532  NA 141 142 141  K 3.4* 3.6 3.2*  CL 94* 105 108  CO2 34* 28 26  GLUCOSE 108* 95 100*  BUN 26* 21* 18  CREATININE 4.76* 3.80* 3.47*  CALCIUM 10.8* 9.1 8.5*   GFR: Estimated Creatinine Clearance: 16.7 mL/min (A) (by C-G formula based on SCr of 3.47 mg/dL (H)). Liver Function Tests:  Recent Labs Lab 05/27/17 0241  AST 28  ALT 18  ALKPHOS 24*  BILITOT 0.6  PROT 6.3*  ALBUMIN 3.1*    Recent Labs Lab 05/27/17 0241  LIPASE 79*   No results for input(s): AMMONIA in the last 168 hours. Coagulation Profile: No results for input(s): INR, PROTIME in the last 168 hours. Cardiac Enzymes: No results for input(s): CKTOTAL, CKMB, CKMBINDEX, TROPONINI in the last 168 hours. BNP (last 3 results) No results for input(s): PROBNP in the last 8760 hours. HbA1C: No results for input(s): HGBA1C in the last 72 hours. CBG: No results for input(s): GLUCAP in the last 168 hours. Lipid Profile: No results for input(s):  CHOL, HDL, LDLCALC, TRIG, CHOLHDL, LDLDIRECT in the last 72 hours. Thyroid Function Tests: No results for input(s): TSH, T4TOTAL, FREET4, T3FREE, THYROIDAB in the last 72 hours. Anemia Panel: No results for input(s): VITAMINB12, FOLATE, FERRITIN, TIBC, IRON, RETICCTPCT in the last 72 hours. Sepsis Labs: No results for input(s): PROCALCITON, LATICACIDVEN in the last 168 hours.  No results found for this or any previous visit (from the past 240 hour(s)).    Radiology Studies: No results found.    Scheduled Meds: . amLODipine  10 mg Oral Daily  . feeding supplement  1 Container Oral TID BM  . feeding supplement (PRO-STAT SUGAR FREE 64)  30 mL Oral BID  . heparin  5,000 Units Subcutaneous Q8H  . isosorbide mononitrate  30 mg Oral Daily  . metoCLOPramide (REGLAN) injection  5 mg Intravenous Q8H  . metoprolol tartrate  25 mg Oral BID  . multivitamin  15 mL Oral Daily  . pantoprazole  40 mg Oral BID  . [START ON 06/01/2017] Vitamin D (Ergocalciferol)  50,000 Units Oral Q Wed   Continuous Infusions: . sodium chloride 125 mL/hr at 05/28/17 1550     LOS: 1 day    Time spent: Total of 25 minutes spent with pt, greater than 50% of which was spent in discussion of  treatment, counseling and coordination of care    Latrelle Dodrill, MD Pager: Text Page via [email or web address]   If 7PM-7AM, please contact night-coverage [email or web address] 05/28/2017, 4:14 PM

## 2017-05-29 LAB — BASIC METABOLIC PANEL
ANION GAP: 7 (ref 5–15)
BUN: 16 mg/dL (ref 6–20)
CHLORIDE: 112 mmol/L — AB (ref 101–111)
CO2: 22 mmol/L (ref 22–32)
Calcium: 8.5 mg/dL — ABNORMAL LOW (ref 8.9–10.3)
Creatinine, Ser: 3.17 mg/dL — ABNORMAL HIGH (ref 0.44–1.00)
GFR, EST AFRICAN AMERICAN: 19 mL/min — AB (ref >60)
GFR, EST NON AFRICAN AMERICAN: 17 mL/min — AB (ref >60)
GLUCOSE: 100 mg/dL — AB (ref 65–99)
Potassium: 3.4 mmol/L — ABNORMAL LOW (ref 3.5–5.1)
Sodium: 141 mmol/L (ref 135–145)

## 2017-05-29 LAB — CBC WITH DIFFERENTIAL/PLATELET
BASOS ABS: 0 10*3/uL (ref 0.0–0.1)
BASOS PCT: 0 %
EOS PCT: 6 %
Eosinophils Absolute: 0.3 10*3/uL (ref 0.0–0.7)
HCT: 25.7 % — ABNORMAL LOW (ref 36.0–46.0)
Hemoglobin: 8.2 g/dL — ABNORMAL LOW (ref 12.0–15.0)
Lymphocytes Relative: 39 %
Lymphs Abs: 2 10*3/uL (ref 0.7–4.0)
MCH: 27.2 pg (ref 26.0–34.0)
MCHC: 31.9 g/dL (ref 30.0–36.0)
MCV: 85.1 fL (ref 78.0–100.0)
MONO ABS: 0.5 10*3/uL (ref 0.1–1.0)
MONOS PCT: 9 %
Neutro Abs: 2.4 10*3/uL (ref 1.7–7.7)
Neutrophils Relative %: 46 %
PLATELETS: 366 10*3/uL (ref 150–400)
RBC: 3.02 MIL/uL — ABNORMAL LOW (ref 3.87–5.11)
RDW: 14.7 % (ref 11.5–15.5)
WBC: 5.2 10*3/uL (ref 4.0–10.5)

## 2017-05-29 LAB — MAGNESIUM: Magnesium: 1.5 mg/dL — ABNORMAL LOW (ref 1.7–2.4)

## 2017-05-29 MED ORDER — METOCLOPRAMIDE HCL 10 MG PO TABS: 10.0000 mg | ORAL_TABLET | Freq: Three times a day (TID) | ORAL | 0 refills | Status: DC

## 2017-05-29 NOTE — Discharge Summary (Signed)
Physician Discharge Summary  Pamela Goodwin  ZOX:[id number]  DOB: 1971/01/28  DOA: 05/26/2017 PCP: [name], MD  Admit date: 05/26/2017 Discharge date: 05/29/2017  Admitted From: Home  Disposition: Home   Recommendations for Outpatient Follow-up:  1. Follow up with PCP in 1 weeks 2. Please obtain BMP/CBC in one week to monitor renal function and Hgb  3. Many need nephrology evaluation in the near future  4. Patient need GI follow up as outpatient  5. Medication were adjusted, advise to eat small frequent meals.   Home Health: PT/RN Social work, aide   Discharge Condition: Stable  CODE STATUS: FULL  Diet recommendation: Heart Healthy / Carb Modified   Brief/Interim Summary: Pamela Goodwin  It 46 year old female with medical history significant of diabetes mellitus type 2, hypertension, chronic kidney disease stage IV, diabetic gastroparesis diagnosed in July 2018. Patient was brought to the ED by family complaining of intermittent vomiting for the past couple of weeks. Patient was admitted for diabetic gastroparesis and acute renal failure, she was started on IV fluids and IV Reglan. Nausea and vomiting subsequently stopped she was started on clear liquids which she tolerated well diet was advanced as tolerated. Creatinine improved. Patient clinically improved from remained stable for discharge.  Subjective: Patient seen and examined, tolerating diet well. Denies nausea and vomiting. Afebrile. Good urine output.  Discharge Diagnoses/Hospital Course:  Principal Problem:   Intractable nausea and vomiting Active Problems:   Acute kidney injury superimposed on chronic kidney disease (HCC)   Diabetic gastroparesis (HCC)   Essential hypertension   Protein-calorie malnutrition, severe  Acute exacerbation of gastroparesis  Patient had gastric emptying study shows gastroparesis in July 2018.  Patient was taking Reglan as needed - she need schedule dose, increase reglan to 10 mg TID.   She was advised to take small frequent meals  Encourage hydration alkaline water preferred  Consider erythromycin if Reglan fails, monitor Cr closely  Control DM - her A1c was 6.4 in July  Encourage diet and exercise    GI evaluation as outpatient to establish care    AKI on CKD stage IV Cr on admission 4.76 - baseline Cr ~ 3 - upon discharge 3.17 Hydration  Check BMP in 1 week  Follow up with nephrology - may need to start conversation of HD, N/V could potentially be due to uremic sx   Essential hypertension  BP was acceptable during hospital stay  Home BP medication were continued with no changes   DM type 2  CBG's were stable during hospital stay  A1C in July 6.4 - need to repeat as outpatient  Not on medication, follow up with PCP   All other chronic medical condition were stable during the hospitalization.  On the day of the discharge the patient's vitals were stable, and no other acute medical condition were reported by patient. Patient was felt safe to be discharge to home   Discharge Instructions  You were cared for by a hospitalist during your hospital stay. If you have any questions about your discharge medications or the care you received while you were in the hospital after you are discharged, you can call the unit and asked to speak with the hospitalist on call if the hospitalist that took care of you is not available. Once you are discharged, your primary care physician will handle any further medical issues. Please note that NO REFILLS for any discharge medications will be authorized once you are discharged, as it is imperative that you return to  your primary care physician (or establish a relationship with a primary care physician if you do not have one) for your aftercare needs so that they can reassess your need for medications and monitor your lab values.  Discharge Instructions    Call MD for:  difficulty breathing, headache or visual disturbances    Complete by:   As directed    Call MD for:  extreme fatigue    Complete by:  As directed    Call MD for:  hives    Complete by:  As directed    Call MD for:  persistant dizziness or light-headedness    Complete by:  As directed    Call MD for:  persistant nausea and vomiting    Complete by:  As directed    Call MD for:  redness, tenderness, or signs of infection (pain, swelling, redness, odor or green/yellow discharge around incision site)    Complete by:  As directed    Call MD for:  severe uncontrolled pain    Complete by:  As directed    Call MD for:  temperature >100.4    Complete by:  As directed    Diet - low sodium heart healthy    Complete by:  As directed    Increase activity slowly    Complete by:  As directed      Allergies as of 05/29/2017      Reactions   Shellfish Allergy Anaphylaxis   Angioedema also   Hydralazine Other (See Comments)   Patient "zoned out" and caused extreme lethargy (??)      Medication List    TAKE these medications   acetaminophen 325 MG tablet Commonly known as:  TYLENOL Take 2 tablets (650 mg total) by mouth every 6 (six) hours as needed for mild pain or moderate pain (or Fever >/= 101).   amLODipine 10 MG tablet Commonly known as:  NORVASC Take 1 tablet (10 mg total) by mouth daily.   isosorbide mononitrate 30 MG 24 hr tablet Commonly known as:  IMDUR Take 30 mg by mouth daily.   metoCLOPramide 10 MG tablet Commonly known as:  REGLAN Take 1 tablet (10 mg total) by mouth 3 (three) times daily before meals. What changed:  when to take this  reasons to take this   metoprolol tartrate 25 MG tablet Commonly known as:  LOPRESSOR Take 1 tablet (25 mg total) by mouth 2 (two) times daily.   ondansetron 4 MG tablet Commonly known as:  ZOFRAN Take 1 tablet (4 mg total) by mouth every 6 (six) hours as needed for nausea.   pantoprazole 40 MG tablet Commonly known as:  PROTONIX Take 1 tablet (40 mg total) by mouth 2 (two) times daily.    Vitamin D3 50000 units Caps Take 50,000 Units by mouth every Wednesday.            Discharge Care Instructions        Start     Ordered   05/29/17 0000  metoCLOPramide (REGLAN) 10 MG tablet  3 times daily before meals     05/29/17 1346   05/29/17 0000  Increase activity slowly     05/29/17 1346   05/29/17 0000  Diet - low sodium heart healthy     05/29/17 1346   05/29/17 0000  Call MD for:  temperature >100.4     05/29/17 1346   05/29/17 0000  Call MD for:  persistant nausea and vomiting     05/29/17 1346  05/29/17 0000  Call MD for:  severe uncontrolled pain     05/29/17 1346   05/29/17 0000  Call MD for:  redness, tenderness, or signs of infection (pain, swelling, redness, odor or green/yellow discharge around incision site)     05/29/17 1346   05/29/17 0000  Call MD for:  difficulty breathing, headache or visual disturbances     05/29/17 1346   05/29/17 0000  Call MD for:  hives     05/29/17 1346   05/29/17 0000  Call MD for:  persistant dizziness or light-headedness     05/29/17 1346   05/29/17 0000  Call MD for:  extreme fatigue     05/29/17 1346     Follow-up Information    [name], MD. Schedule an appointment as soon as possible for a visit in 1 week(s).   Specialty:  Internal Medicine Why:  hospital follow up  Contact information: [address] [place] [address] [phone]        [facility] Follow up.   Why:  HHRN/PT/aide/social work- arranged- they will call to set up home visits Contact information: [address] [place] [address] [phone]          Allergies  Allergen Reactions  . Shellfish Allergy Anaphylaxis    Angioedema also  . Hydralazine Other (See Comments)    Patient "zoned out" and caused extreme lethargy (??)    Consultations:  None   Procedures/Studies: Ct Abdomen Pelvis Wo Contrast  Result Date: 05/10/2017 CLINICAL DATA:  46 y/o  F; severe abdominal pain. EXAM: CT ABDOMEN AND  PELVIS WITHOUT CONTRAST TECHNIQUE: Multidetector CT imaging of the abdomen and pelvis was performed following the standard protocol without IV contrast. COMPARISON:  03/26/2017 CT abdomen and pelvis FINDINGS: Lower chest: No acute abnormality. Hepatobiliary: No focal liver abnormality is seen. No gallstones, gallbladder wall thickening, or biliary dilatation. Pancreas: Unremarkable. Spleen: Normal in size without focal abnormality. Adrenals/Urinary Tract: Adrenal glands are unremarkable. Kidneys are normal, without renal calculi, focal lesion, or hydronephrosis. Bladder is unremarkable. Stomach/Bowel: Stomach is within normal limits. Appendix appears normal. No evidence of bowel wall thickening, distention, or inflammatory changes. Vascular/Lymphatic: Aortic atherosclerosis. No enlarged abdominal or pelvic lymph nodes. Reproductive: Uterus and bilateral adnexa are unremarkable. IUD well seated with the uterine fundus. Other: No abdominal wall hernia or abnormality. No abdominopelvic ascites. Musculoskeletal: No acute or significant osseous findings. IMPRESSION: No acute process identified as explanation for pain. No acute peripancreatic collection. Electronically Signed   By: Mitzi Hansen M.D.   On: 05/10/2017 01:39   Ct Chest Wo Contrast  Result Date: 05/26/2017 CLINICAL DATA:  Followup pulmonary nodules EXAM: CT CHEST WITHOUT CONTRAST TECHNIQUE: Multidetector CT imaging of the chest was performed following the standard protocol without IV contrast. COMPARISON:  03/11/2017 FINDINGS: Cardiovascular: The heart size appears within normal limits. Lad coronary artery calcifications identified. No pericardial effusion. Mediastinum/Nodes: The trachea appears patent and is midline. Normal appearance of the esophagus. No enlarged mediastinal or hilar lymph nodes. Lungs/Pleura: No airspace opacities identified. No pleural effusion. Scar versus subsegmental atelectasis within the lingula and left lower lobe  noted. This is improved when compared with the previous examination. Previously noted lower lung zone predominant pulmonary nodular densities have resolved in the interval compatible with an inflammatory or infectious process. No new pulmonary nodules or masses identified. Upper Abdomen: No acute abnormality. Musculoskeletal: No chest wall mass or suspicious bone lesions identified. IMPRESSION: 1. Interval resolution of previous pulmonary nodules compatible  with an inflammatory or infectious process. 2. Mild residual subsegmental atelectasis/scarring in the lower lung zones. 3. Lad coronary artery calcifications. Electronically Signed   By: Signa Kell M.D.   On: 05/26/2017 10:00    Discharge Exam: Vitals:   05/29/17 0032 05/29/17 0510  BP: (!) 143/91 124/77  Pulse: (!) 107 (!) 104  Resp: 17 17  Temp:  99.6 F (37.6 C)  SpO2: 97% 94%   Vitals:   05/28/17 2243 05/28/17 2309 05/29/17 0032 05/29/17 0510  BP: (!) 146/94 (!) 141/102 (!) 143/91 124/77  Pulse: (!) 104 99 (!) 107 (!) 104  Resp: Temp: 97.8 F (36.6 C) 98.1 F (36.7 C)  99.6 F (37.6 C)  TempSrc: Oral Oral  Oral  SpO2: 99% 100% 97% 94%  Weight:      Height:        General: Pt is alert, awake, not in acute distress Cardiovascular: RRR, S1/S2 +, no rubs, no gallops Respiratory: CTA bilaterally, no wheezing, no rhonchi Abdominal: Soft, NT, ND, bowel sounds + Extremities: no edema, no cyanosis   The results of significant diagnostics from this hospitalization (including imaging, microbiology, ancillary and laboratory) are listed below for reference.     Microbiology: No results found for this or any previous visit (from the past 240 hour(s)).   Labs: BNP (last 3 results)  Recent Labs  03/11/17 1425  BNP 135.6*   Basic Metabolic Panel:  Recent Labs Lab 05/27/17 0241 05/27/17 1818 05/28/17 0532 05/29/17 0533  NA 141 142 141 141  K 3.4* 3.6 3.2* 3.4*  CL 94* 105 108 112*  CO2 34* GLUCOSE 108* 95 100* 100*  BUN 26* 21* 18 16  CREATININE 4.76* 3.80* 3.47* 3.17*  CALCIUM 10.8* 9.1 8.5* 8.5*  MG  --   --   --  1.5*   Liver Function Tests:  Recent Labs Lab 05/27/17 0241  AST 28  ALT 18  ALKPHOS 24*  BILITOT 0.6  PROT 6.3*  ALBUMIN 3.1*    Recent Labs Lab 05/27/17 0241  LIPASE 79*   No results for input(s): AMMONIA in the last 168 hours. CBC:  Recent Labs Lab 05/27/17 0241 05/28/17 0532 05/29/17 0533  WBC 5.4 5.5 5.2  NEUTROABS 2.5  --  2.4  HGB 9.9* 8.2* 8.2*  HCT 30.6* 25.1* 25.7*  MCV 85.0 85.4 85.1  PLT 458* 343 366   Cardiac Enzymes: No results for input(s): CKTOTAL, CKMB, CKMBINDEX, TROPONINI in the last 168 hours. BNP: Invalid input(s): POCBNP CBG: No results for input(s): GLUCAP in the last 168 hours. D-Dimer No results for input(s): DDIMER in the last 72 hours. Hgb A1c No results for input(s): HGBA1C in the last 72 hours. Lipid Profile No results for input(s): CHOL, HDL, LDLCALC, TRIG, CHOLHDL, LDLDIRECT in the last 72 hours. Thyroid function studies No results for input(s): TSH, T4TOTAL, T3FREE, THYROIDAB in the last 72 hours.  Invalid input(s): FREET3 Anemia work up No results for input(s): VITAMINB12, FOLATE, FERRITIN, TIBC, IRON, RETICCTPCT in the last 72 hours. Urinalysis    Component Value Date/Time   COLORURINE YELLOW 05/27/2017 0134   APPEARANCEUR HAZY (A) 05/27/2017 0134   LABSPEC 1.019 05/27/2017 0134   PHURINE 6.0 05/27/2017 0134   GLUCOSEU NEGATIVE 05/27/2017 0134   HGBUR NEGATIVE 05/27/2017 0134   BILIRUBINUR NEGATIVE 05/27/2017 0134   KETONESUR 5 (A) 05/27/2017 0134   PROTEINUR >=300 (A) 05/27/2017 0134   NITRITE NEGATIVE 05/27/2017 0134   LEUKOCYTESUR  NEGATIVE 05/27/2017 0134   Sepsis Labs Invalid input(s): PROCALCITONIN,  WBC,  LACTICIDVEN Microbiology No results found for this or any previous visit (from the past 240 hour(s)).   Time coordinating discharge: 35 minutes  SIGNED:  Latrelle Dodrill, MD  [facility] 05/29/2017, 6:54 PM  Pager please text page via  [email or web address] Password [email or web address]

## 2017-05-29 NOTE — Care Management Note (Signed)
Case Management Note Previous CM note initiated by Adron Bene, RN 05/12/2017, 3:18 PM   Patient Details  Name: Pamela Goodwin MRN: [id number] Date of Birth: 10-03-1970  Subjective/Objective:       CM following for progression and d/c planning.      Pt lives with father and brother.         Action/Plan: 05/12/2017 Met with pt on 05/10/2017 , will follow for d/c needs.   Expected Discharge Date:  05/29/17               Expected Discharge Plan:  [place]  In-House Referral:     Discharge planning Services  CM Consult  Post Acute Care Choice:  Home Health, Resumption of Svcs/PTA Provider Choice offered to:  Patient, Sibling  DME Arranged:  N/A DME Agency:  NA  HH Arranged:  RN, PT, Nurse's Aide, Social Work [employer] Agency:  [place]  Status of Service:  Completed, signed off  If discussed at [employer], dates discussed:    Discharge Disposition: home/home health   Additional Comments:  05/29/17- 1515- [name] rN, CM-  Orders placed for HHRN/PT/aide/sw- call made to room and spoke with pt's sister- per sister pt was active with [facility] services and family would like to stay with them- referral called to [facility] with [facility] for HHRN/PT/aide/sw- to resume services.   Dahlia Client [place], RN 05/29/2017, 3:18 PM [phone]

## 2017-05-29 NOTE — Progress Notes (Signed)
Nsg Discharge Note  Admit Date:  05/26/2017 Discharge date: 05/29/2017   Wyvonnia Lora to be D/C'd Home per MD order.  AVS completed.  Copy for chart, and copy for patient signed, and dated. Patient/caregiver able to verbalize understanding.  Discharge Medication: Allergies as of 05/29/2017      Reactions   Shellfish Allergy Anaphylaxis   Angioedema also   Hydralazine Other (See Comments)   Patient "zoned out" and caused extreme lethargy (??)      Medication List    TAKE these medications   acetaminophen 325 MG tablet Commonly known as:  TYLENOL Take 2 tablets (650 mg total) by mouth every 6 (six) hours as needed for mild pain or moderate pain (or Fever >/= 101).   amLODipine 10 MG tablet Commonly known as:  NORVASC Take 1 tablet (10 mg total) by mouth daily.   isosorbide mononitrate 30 MG 24 hr tablet Commonly known as:  IMDUR Take 30 mg by mouth daily.   metoCLOPramide 10 MG tablet Commonly known as:  REGLAN Take 1 tablet (10 mg total) by mouth 3 (three) times daily before meals. What changed:  when to take this  reasons to take this   metoprolol tartrate 25 MG tablet Commonly known as:  LOPRESSOR Take 1 tablet (25 mg total) by mouth 2 (two) times daily.   ondansetron 4 MG tablet Commonly known as:  ZOFRAN Take 1 tablet (4 mg total) by mouth every 6 (six) hours as needed for nausea.   pantoprazole 40 MG tablet Commonly known as:  PROTONIX Take 1 tablet (40 mg total) by mouth 2 (two) times daily.   Vitamin D3 50000 units Caps Take 50,000 Units by mouth every Wednesday.            Discharge Care Instructions        Start     Ordered   05/29/17 0000  metoCLOPramide (REGLAN) 10 MG tablet  3 times daily before meals     05/29/17 1346   05/29/17 0000  Increase activity slowly     05/29/17 1346   05/29/17 0000  Diet - low sodium heart healthy     05/29/17 1346   05/29/17 0000  Call MD for:  temperature >100.4     05/29/17 1346   05/29/17 0000  Call MD  for:  persistant nausea and vomiting     05/29/17 1346   05/29/17 0000  Call MD for:  severe uncontrolled pain     05/29/17 1346   05/29/17 0000  Call MD for:  redness, tenderness, or signs of infection (pain, swelling, redness, odor or green/yellow discharge around incision site)     05/29/17 1346   05/29/17 0000  Call MD for:  difficulty breathing, headache or visual disturbances     05/29/17 1346   05/29/17 0000  Call MD for:  hives     05/29/17 1346   05/29/17 0000  Call MD for:  persistant dizziness or light-headedness     05/29/17 1346   05/29/17 0000  Call MD for:  extreme fatigue     05/29/17 1346      Discharge Assessment: Vitals:   05/29/17 0032 05/29/17 0510  BP: (!) 143/91 124/77  Pulse: (!) 107 (!) 104  Resp: 17 17  Temp:  99.6 F (37.6 C)  SpO2: 97% 94%   Skin clean, dry and intact without evidence of skin break down, no evidence of skin tears noted. IV catheter discontinued intact. Site without signs and symptoms of  complications - no redness or edema noted at insertion site, patient denies c/o pain - only slight tenderness at site.  Dressing with slight pressure applied.  D/c Instructions-Education: Discharge instructions given to patient/family with verbalized understanding. D/c education completed with patient/family including follow up instructions, medication list, d/c activities limitations if indicated, with other d/c instructions as indicated by MD - patient able to verbalize understanding, all questions fully answered. Patient instructed to return to ED, call 911, or call MD for any changes in condition.  Patient escorted via WC, and D/C home via private auto.  Camillo Flaming, RN 05/29/2017 2:48 PM

## 2017-06-15 ENCOUNTER — Other Ambulatory Visit: Payer: Self-pay

## 2017-06-15 DIAGNOSIS — N184 Chronic kidney disease, stage 4 (severe): Secondary | ICD-10-CM

## 2017-06-15 DIAGNOSIS — Z01810 Encounter for preprocedural cardiovascular examination: Secondary | ICD-10-CM

## 2017-06-22 ENCOUNTER — Encounter: Payer: Self-pay | Admitting: Vascular Surgery

## 2017-06-23 ENCOUNTER — Encounter: Payer: Self-pay | Admitting: Vascular Surgery

## 2017-06-23 ENCOUNTER — Ambulatory Visit (INDEPENDENT_AMBULATORY_CARE_PROVIDER_SITE_OTHER)
Admission: RE | Admit: 2017-06-23 | Discharge: 2017-06-23 | Disposition: A | Discharge status: Home / Self Care | Payer: Medicare Other | Source: Ambulatory Visit | Attending: Vascular Surgery | Admitting: Vascular Surgery

## 2017-06-23 ENCOUNTER — Encounter: Payer: Self-pay | Admitting: *Deleted

## 2017-06-23 ENCOUNTER — Ambulatory Visit (HOSPITAL_COMMUNITY)
Admission: RE | Admit: 2017-06-23 | Discharge: 2017-06-23 | Disposition: A | Discharge status: Home / Self Care | Payer: Medicare Other | Source: Ambulatory Visit | Attending: Vascular Surgery | Admitting: Vascular Surgery

## 2017-06-23 ENCOUNTER — Ambulatory Visit (INDEPENDENT_AMBULATORY_CARE_PROVIDER_SITE_OTHER): Payer: Medicare Other | Admitting: Vascular Surgery

## 2017-06-23 ENCOUNTER — Other Ambulatory Visit: Payer: Self-pay | Admitting: *Deleted

## 2017-06-23 VITALS — BP 123/98 | HR 96 | Temp 97.4°F | Resp 16 | Ht 60.0 in | Wt 133.0 lb

## 2017-06-23 DIAGNOSIS — N184 Chronic kidney disease, stage 4 (severe): Secondary | ICD-10-CM

## 2017-06-23 DIAGNOSIS — Z01810 Encounter for preprocedural cardiovascular examination: Secondary | ICD-10-CM
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
## 2017-07-06 ENCOUNTER — Ambulatory Visit (HOSPITAL_COMMUNITY): Admission: RE | Admit: 2017-07-06 | Payer: Medicare Other | Source: Ambulatory Visit | Admitting: Vascular Surgery

## 2017-07-06 SURGERY — TRANSPOSITION, VEIN, BASILIC
Anesthesia: Monitor Anesthesia Care | Laterality: Left

## 2018-02-13 IMAGING — NM NM GASTRIC EMPTYING
5 series · 5 of 5 positions shown · non-contrast
Comparison: None.

CLINICAL DATA: Gastroparesis.

EXAM:
NUCLEAR MEDICINE GASTRIC EMPTYING SCAN
TECHNIQUE: After oral ingestion of radiolabeled meal, sequential abdominal
images were obtained for 4 hours. Percentage of activity emptying
the stomach was calculated at 1 hour, 2 hour, 3 hour, and 4 hours.
RADIOPHARMACEUTICALS:  2.0 mCi Bc-WWm sulfur colloid in standardized
meal

[ge gastric empty · 2.51mm/px · 1 of 1 slices shown (1 of 5)]
[im 1/1]
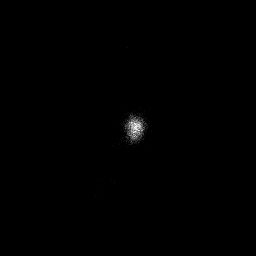

[ge gastric empty · 2.51mm/px · 1 of 1 slices shown (2 of 5)]
[im 1/1]
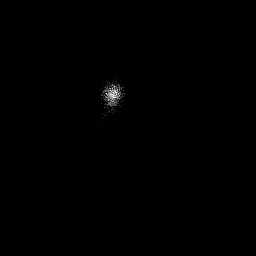

[ge gastric empty · 2.51mm/px · 1 of 1 slices shown (3 of 5)]
[im 1/1]
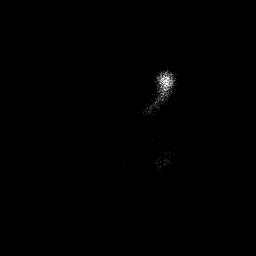

[ge gastric empty · 2.51mm/px · 1 of 1 slices shown (4 of 5)]
[im 1/1]
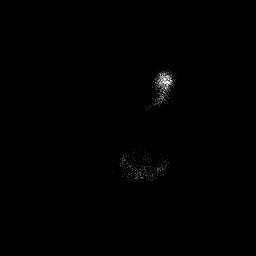

[ge gastric empty · 2.51mm/px · 1 of 1 slices shown (5 of 5)]
[im 1/1]
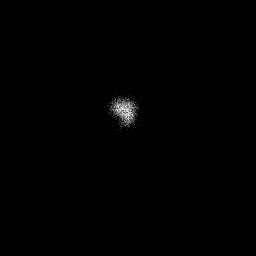

[5 of 5 positions shown; findings below may reference images not displayed]

FINDINGS: Expected location of the stomach in the left upper quadrant.
Ingested meal empties the stomach gradually over the course of the
study.

8% emptied at 1 hr ( normal >= 10%)

1% emptied at 2 hr ( normal >= 40%)

10% emptied at 3 hr ( normal >= 70%)

26% emptied at 4 hr ( normal >= 90%)
IMPRESSION: Delayed gastric emptying study.  Only 26% emptying at 4 hours.

## 2018-03-17 ENCOUNTER — Ambulatory Visit: Payer: Self-pay | Admitting: Surgery

## 2018-03-17 NOTE — H&P (Signed)
Pamela Goodwin Documented: 03/17/2018 2:01 PM Location: [facility] Patient #: [id number] DOB: 10-Oct-1970 Single / Language: [place] / Race: Black or African American Female  History of Present Illness (Pamela Goodwin Goodwin. Pamela Bonineonnor MD; 03/17/2018 3:09 PM) Patient words: This is Goodwin 47 year old woman here with her father and her sister to discuss peritoneal dialysis catheter placement. Her father is her primary caretaker and Management consultantdecision maker. History of developmental delay, type 2 diabetes currently managed without medication, hypertension, CKD stage IV, diabetic gastroparesis requiring multiple hospital admissions most recently in September 2018. She has not started on dialysis. She was set to have Goodwin fistula placed by Dr. Darrick PennaFields in October but this never happened. NO prior surgeries.  I do not have any records of recent labs. Her father states that her creatinine is up to 7 something.  The patient is Goodwin 47 year old female.   Past Surgical History Pamela Goodwin(Pamela Goodwin; 03/17/2018 2:01 PM) No pertinent past surgical history  Diagnostic Studies History Pamela Goodwin(Pamela Goodwin; 03/17/2018 2:01 PM) Colonoscopy never Mammogram 1-3 years ago  Allergies Pamela Goodwin(Pamela Goodwin; 03/17/2018 2:02 PM) No Known Drug Allergies [03/17/2018]: Allergies Reconciled  Medication History Pamela Goodwin(Pamela Goodwin; 03/17/2018 2:02 PM) AmLODIPine Besylate (10MG  Tablet, Oral) Active. Amoxicillin-Pot Clavulanate (500-125MG  Tablet, Oral) Active. Isosorbide Dinitrate (30MG  Tablet, Oral) Active. Metoprolol Tartrate (25MG  Tablet, Oral) Active. Mirtazapine (15MG  Tablet, Oral) Active. Pantoprazole Sodium (40MG  Tablet DR, Oral) Active. Pazeo (0.7% Solution, Ophthalmic) Active. TiZANidine HCl (2MG  Tablet, Oral) Active. Medications Reconciled  Social History Pamela Goodwin(Pamela Goodwin; 03/17/2018 2:01 PM) Caffeine use Carbonated beverages, Coffee, Tea. No alcohol use Tobacco use Never smoker.  Family History Pamela Goodwin(Pamela Goodwin; 03/17/2018 2:01  PM) Heart disease in female family member before age 47 Hypertension Father.  Pregnancy / Birth History Pamela Goodwin(Pamela Goodwin; 03/17/2018 2:01 PM) Age at menarche 11 years.  Other Problems Pamela Goodwin(Pamela Goodwin; 03/17/2018 2:01 PM) Arthritis Heart murmur High blood pressure     Review of Systems Pamela Goodwin(Pamela Goodwin; 03/17/2018 2:01 PM) General Not Present- Appetite Loss, Chills, Fatigue, Fever, Night Sweats, Weight Gain and Weight Loss. Skin Not Present- Change in Wart/Mole, Dryness, Hives, Jaundice, New Lesions, Non-Healing Wounds, Rash and Ulcer. HEENT Not Present- Earache, Hearing Loss, Hoarseness, Nose Bleed, Oral Ulcers, Ringing in the Ears, Seasonal Allergies, Sinus Pain, Sore Throat, Visual Disturbances, Wears glasses/contact lenses and Yellow Eyes. Respiratory Not Present- Bloody sputum, Chronic Cough, Difficulty Breathing, Snoring and Wheezing. Breast Not Present- Breast Mass, Breast Pain, Nipple Discharge and Skin Changes. Cardiovascular Not Present- Chest Pain, Difficulty Breathing Lying Down, Leg Cramps, Palpitations, Rapid Heart Rate, Shortness of Breath and Swelling of Extremities. Gastrointestinal Not Present- Abdominal Pain, Bloating, Bloody Stool, Change in Bowel Habits, Chronic diarrhea, Constipation, Difficulty Swallowing, Excessive gas, Gets full quickly at meals, Hemorrhoids, Indigestion, Nausea, Rectal Pain and Vomiting. Female Genitourinary Not Present- Frequency, Nocturia, Painful Urination, Pelvic Pain and Urgency. Musculoskeletal Not Present- Back Pain, Joint Pain, Joint Stiffness, Muscle Pain, Muscle Weakness and Swelling of Extremities. Neurological Not Present- Decreased Memory, Fainting, Headaches, Numbness, Seizures, Tingling, Tremor, Trouble walking and Weakness. Psychiatric Not Present- Anxiety, Bipolar, Change in Sleep Pattern, Depression, Fearful and Frequent crying. Endocrine Not Present- Cold Intolerance, Excessive Hunger, Hair Changes, Heat Intolerance, Hot flashes and  New Diabetes. Hematology Not Present- Blood Thinners, Easy Bruising, Excessive bleeding, Gland problems, HIV and Persistent Infections.  Vitals Pamela Goodwin(Pamela Goodwin; 03/17/2018 2:03 PM) 03/17/2018 2:02 PM Weight: 161.25 lb Height: 60in Body Surface Area: 1.7 m Body Mass Index: 31.49 kg/m  Temp.: 98.61F(Oral)  Pulse: 95 (Regular)  BP: 150/92 (Sitting, Left Arm, Standard)  Physical Exam (Pamela Goodwin. Pamela Bonine MD; 03/17/2018 3:10 PM)  The physical exam findings are as follows: Note:Gen: alert and cooperative Eye: extraocular motion intact, no scleral icterus ENT: moist mucus membranes, dentition intact Neck: no mass or thyromegaly Chest: unlabored respirations, symmetrical air entry, clear bilaterally CV: regular rate and rhythm, no pedal edema Abdomen: soft, nontender, nondistended. No mass or organomegaly. obese. MSK: strength symmetrical throughout, no deformity Neuro: She does have Goodwin developmental delay but is able to answer some questions. Grossly intact, normal gait Psych: normal mood and affect, limited insight Skin: warm and dry, no rash or lesion on limited exam    Assessment & Plan (Pamela Goodwin Goodwin. Pamela Bonine MD; 03/17/2018 3:12 PM)  CKD (CHRONIC KIDNEY DISEASE), STAGE IV (N18.4) Story: She has not yet started dialysis. Peritoneal dialysis would be reasonable, but I suspect that she will require temporary venous access before this can initiate. We discussed the surgery including risks of bleeding, infection, pain, scarring, intra-abdominal injury, catheter occlusion or malfunction, need for revision or removal of the catheter, and intolerance of peritoneal dialysis requiring her to ultimately go on hemodialysis. Questions were answered. She will need cardiac clearance prior to general anesthesia.   DIABETES (E11.9) Story: reports she is not on any medications   HYPERTENSION (I10)   GERD (GASTROESOPHAGEAL REFLUX DISEASE) (K21.9)   GASTROPARESIS (K31.84)

## 2018-03-24 ENCOUNTER — Ambulatory Visit (INDEPENDENT_AMBULATORY_CARE_PROVIDER_SITE_OTHER): Payer: Medicare Other | Admitting: Cardiovascular Disease

## 2018-03-24 ENCOUNTER — Encounter: Payer: Self-pay | Admitting: Cardiovascular Disease

## 2018-03-24 VITALS — BP 122/98 | HR 93 | Ht 60.0 in | Wt 164.0 lb

## 2018-03-24 DIAGNOSIS — Z01810 Encounter for preprocedural cardiovascular examination: Secondary | ICD-10-CM | POA: Diagnosis not present

## 2018-03-24 DIAGNOSIS — Z01818 Encounter for other preprocedural examination: Secondary | ICD-10-CM | POA: Diagnosis not present

## 2018-03-24 DIAGNOSIS — Z992 Dependence on renal dialysis: Secondary | ICD-10-CM

## 2018-03-24 DIAGNOSIS — N2889 Other specified disorders of kidney and ureter: Secondary | ICD-10-CM

## 2018-03-24 DIAGNOSIS — N186 End stage renal disease: Secondary | ICD-10-CM

## 2018-03-24 DIAGNOSIS — I151 Hypertension secondary to other renal disorders: Secondary | ICD-10-CM | POA: Diagnosis not present

## 2018-03-24 NOTE — Patient Instructions (Signed)
Medication Instructions:  Continue all current medications.  Labwork: none  Testing/Procedures: none  Follow-Up: As needed.    Any Other Special Instructions Will Be Listed Below (If Applicable).  If you need a refill on your cardiac medications before your next appointment, please call your pharmacy.  
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
## 2018-04-10 NOTE — Pre-Procedure Instructions (Signed)
Konstance Grauberger  04/10/2018      [id number] - [place], [place] - [address] AT [facility] [address] [place] [address] Phone: [phone] Fax: [phone]    Your procedure is scheduled on Wed., Aug. 14, 2019 from 10:30AM-12:00PM  Report to [facility] Admitting Entrance "A" at 8:30AM  Call this number if you have problems the morning of surgery:  [phone]   Remember:  Do not eat or drink after midnight on Aug. 13th    Take these medicines the morning of surgery with A SIP OF WATER: AmLODipine (NORVASC), Isosorbide mononitrate (IMDUR), Metoprolol tartrate (LOPRESSOR), and Pantoprazole (PROTONIX)   If needed Cetirizine (ZYRTEC), Ondansetron (ZOFRAN), and Olopatadine HCl (PAZEO) eye drop  7 days before surgery (8/7), stop taking all Other Aspirin Products, Vitamins, Fish oils, and Herbal medications. Also stop all NSAIDS i.e. Advil, Ibuprofen, Motrin, Aleve, Anaprox, Naproxen, BC, Goody Powders, and all Supplements.   How to Manage Your Diabetes Before and After Surgery  Why is it important to control my blood sugar before and after surgery? . Improving blood sugar levels before and after surgery helps healing and can limit problems. . A way of improving blood sugar control is eating a healthy diet by: o  Eating less sugar and carbohydrates o  Increasing activity/exercise o  Talking with your doctor about reaching your blood sugar goals . High blood sugars (greater than 180 mg/dL) can raise your risk of infections and slow your recovery, so you will need to focus on controlling your diabetes during the weeks before surgery. . Make sure that the doctor who takes care of your diabetes knows about your planned surgery including the date and location.  How do I manage my blood sugar before surgery? . Check your blood sugar at least 4 times a day, starting 2 days before surgery, to make sure that the level is not too high or  low. o Check your blood sugar the morning of your surgery when you wake up and every 2 hours until you get to the Short Stay unit. . If your blood sugar is less than 70 mg/dL, you will need to treat for low blood sugar: o Do not take insulin. o Treat a low blood sugar (less than 70 mg/dL) with  cup of clear juice (cranberry or apple), 4 glucose tablets, OR glucose gel. Recheck blood sugar in 15 minutes after treatment (to make sure it is greater than 70 mg/dL). If your blood sugar is not greater than 70 mg/dL on recheck, call [phone] o  for further instructions. .  If your CBG is greater than 220 mg/dL, inform the staff upon arrival to Short Stay  . If you are admitted to the hospital after surgery: o Your blood sugar will be checked by the staff and you will probably be given insulin after surgery (instead of oral diabetes medicines) to make sure you have good blood sugar levels. o The goal for blood sugar control after surgery is 80-180 mg/dL      Reviewed and Endorsed by [facility] Patient Education Committee, August 2015   Do not wear jewelry, make-up or nail polish.  Do not wear lotions, powders, or perfumes, or deodorant.  Do not shave 48 hours prior to surgery.    Do not bring valuables to the hospital.  [facility] is not responsible for any belongings or valuables.  Contacts, dentures or bridgework may not be worn into  surgery.  Leave your suitcase in the car.  After surgery it may be brought to your room.  For patients admitted to the hospital, discharge time will be determined by your treatment team.  Patients discharged the day of surgery will not be allowed to drive home.   Special instructions:   [facility]- Preparing For Surgery  Before surgery, you can play an important role. Because skin is not sterile, your skin needs to be as free of germs as possible. You can reduce the number of germs on your skin by washing with CHG (chlorahexidine gluconate) Soap before  surgery.  CHG is an antiseptic cleaner which kills germs and bonds with the skin to continue killing germs even after washing.    Oral Hygiene is also important to reduce your risk of infection.  Remember - BRUSH YOUR TEETH THE MORNING OF SURGERY WITH YOUR REGULAR TOOTHPASTE  Please do not use if you have an allergy to CHG or antibacterial soaps. If your skin becomes reddened/irritated stop using the CHG.  Do not shave (including legs and underarms) for at least 48 hours prior to first CHG shower. It is OK to shave your face.  Please follow these instructions carefully.   1. Shower the NIGHT BEFORE SURGERY and the MORNING OF SURGERY with CHG.   2. If you chose to wash your hair, wash your hair first as usual with your normal shampoo.  3. After you shampoo, rinse your hair and body thoroughly to remove the shampoo.  4. Use CHG as you would any other liquid soap. You can apply CHG directly to the skin and wash gently with a scrungie or a clean washcloth.   5. Apply the CHG Soap to your body ONLY FROM THE NECK DOWN.  Do not use on open wounds or open sores. Avoid contact with your eyes, ears, mouth and genitals (private parts). Wash Face and genitals (private parts)  with your normal soap.  6. Wash thoroughly, paying special attention to the area where your surgery will be performed.  7. Thoroughly rinse your body with warm water from the neck down.  8. DO NOT shower/wash with your normal soap after using and rinsing off the CHG Soap.  9. Pat yourself dry with a CLEAN TOWEL.  10. Wear CLEAN PAJAMAS to bed the night before surgery, wear comfortable clothes the morning of surgery  11. Place CLEAN SHEETS on your bed the night of your first shower and DO NOT SLEEP WITH PETS.  Day of Surgery:  Do not apply any deodorants/lotions.  Please wear clean clothes to the hospital/surgery center.   Remember to brush your teeth WITH YOUR REGULAR TOOTHPASTE.  Please read over the following fact  sheets that you were given. Pain Booklet, Coughing and Deep Breathing and Surgical Site Infection Prevention

## 2018-04-11 ENCOUNTER — Encounter (HOSPITAL_COMMUNITY): Payer: Self-pay

## 2018-04-11 ENCOUNTER — Encounter (HOSPITAL_COMMUNITY)
Admission: RE | Admit: 2018-04-11 | Discharge: 2018-04-11 | Disposition: A | Discharge status: Home / Self Care | Payer: Medicare Other | Source: Ambulatory Visit | Attending: Surgery | Admitting: Surgery

## 2018-04-11 ENCOUNTER — Other Ambulatory Visit: Payer: Self-pay

## 2018-04-11 DIAGNOSIS — N185 Chronic kidney disease, stage 5: Secondary | ICD-10-CM | POA: Insufficient documentation

## 2018-04-11 DIAGNOSIS — Z01812 Encounter for preprocedural laboratory examination: Secondary | ICD-10-CM | POA: Insufficient documentation

## 2018-04-11 DIAGNOSIS — E1122 Type 2 diabetes mellitus with diabetic chronic kidney disease: Secondary | ICD-10-CM | POA: Insufficient documentation

## 2018-04-11 DIAGNOSIS — I120 Hypertensive chronic kidney disease with stage 5 chronic kidney disease or end stage renal disease: Secondary | ICD-10-CM | POA: Diagnosis not present

## 2018-04-11 DIAGNOSIS — D631 Anemia in chronic kidney disease: Secondary | ICD-10-CM | POA: Insufficient documentation

## 2018-04-11 DIAGNOSIS — Z79899 Other long term (current) drug therapy: Secondary | ICD-10-CM | POA: Insufficient documentation

## 2018-04-11 DIAGNOSIS — Z01818 Encounter for other preprocedural examination: Secondary | ICD-10-CM | POA: Diagnosis present

## 2018-04-11 DIAGNOSIS — Z01810 Encounter for preprocedural cardiovascular examination: Secondary | ICD-10-CM

## 2018-04-11 LAB — BASIC METABOLIC PANEL
Anion gap: 13 (ref 5–15)
BUN: 63 mg/dL — AB (ref 6–20)
CALCIUM: 9.5 mg/dL (ref 8.9–10.3)
CO2: 21 mmol/L — ABNORMAL LOW (ref 22–32)
CREATININE: 8.18 mg/dL — AB (ref 0.44–1.00)
Chloride: 109 mmol/L (ref 98–111)
GFR calc Af Amer: 6 mL/min — ABNORMAL LOW (ref >60)
GFR, EST NON AFRICAN AMERICAN: 5 mL/min — AB (ref >60)
Glucose, Bld: 181 mg/dL — ABNORMAL HIGH (ref 70–99)
POTASSIUM: 3.9 mmol/L (ref 3.5–5.1)
Sodium: 143 mmol/L (ref 135–145)

## 2018-04-11 LAB — GLUCOSE, CAPILLARY: Glucose-Capillary: 160 mg/dL — ABNORMAL HIGH (ref 70–99)

## 2018-04-11 LAB — HEMOGLOBIN A1C
Hgb A1c MFr Bld: 6.3 % — ABNORMAL HIGH (ref 4.8–5.6)
MEAN PLASMA GLUCOSE: 134.11 mg/dL

## 2018-04-11 LAB — CBC WITH DIFFERENTIAL/PLATELET
Abs Immature Granulocytes: 0.1 10*3/uL (ref 0.0–0.1)
Basophils Absolute: 0.1 10*3/uL (ref 0.0–0.1)
Basophils Relative: 1 %
Eosinophils Absolute: 0.4 10*3/uL (ref 0.0–0.7)
Eosinophils Relative: 5 %
HCT: 26.3 % — ABNORMAL LOW (ref 36.0–46.0)
Hemoglobin: 8.1 g/dL — ABNORMAL LOW (ref 12.0–15.0)
Immature Granulocytes: 1 %
Lymphocytes Relative: 25 %
Lymphs Abs: 2.2 10*3/uL (ref 0.7–4.0)
MCH: 26.8 pg (ref 26.0–34.0)
MCHC: 30.8 g/dL (ref 30.0–36.0)
MCV: 87.1 fL (ref 78.0–100.0)
Monocytes Absolute: 0.6 10*3/uL (ref 0.1–1.0)
Monocytes Relative: 7 %
Neutro Abs: 5.3 10*3/uL (ref 1.7–7.7)
Neutrophils Relative %: 61 %
Platelets: 299 10*3/uL (ref 150–400)
RBC: 3.02 MIL/uL — ABNORMAL LOW (ref 3.87–5.11)
RDW: 13.7 % (ref 11.5–15.5)
WBC: 8.6 10*3/uL (ref 4.0–10.5)

## 2018-04-11 NOTE — Progress Notes (Signed)
PCP - Dr. Kirstie PeriAshish Shah  Cardiologist - Dr. Prentice DockerSuresh Koneswaran- C.C. 03/24/18  Chest x-ray - 04/11/18  EKG - 03/24/18 (E)  Stress Test - Denies  ECHO - 03/15/17 (E)  Cardiac Cath - Denies  Sleep Study - Yes- Negative CPAP - None  LABS- 04/11/18: CBC, BMP  ASA- Denies  HA1C- 04/11/18 Fasting Blood Sugar - 106-112 Checks Blood Sugar ___1__ times a week sometimes. Pt and father sts she has not taken any diabetes medicine in over a year, so they don't check the bs as often anymore.  Anesthesia- No  Pt denies having chest pain, sob, or fever at this time. All instructions explained to the pt, with a verbal understanding of the material. Pt agrees to go over the instructions while at home for a better understanding. The opportunity to ask questions was provided.
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
## 2018-04-19 ENCOUNTER — Ambulatory Visit (HOSPITAL_COMMUNITY)
Admission: RE | Admit: 2018-04-19 | Discharge: 2018-04-19 | Disposition: A | Discharge status: Home / Self Care | Payer: Medicare Other | Source: Ambulatory Visit | Attending: Surgery | Admitting: Surgery

## 2018-04-19 ENCOUNTER — Other Ambulatory Visit: Payer: Self-pay

## 2018-04-19 ENCOUNTER — Ambulatory Visit (HOSPITAL_COMMUNITY): Payer: Medicare Other | Admitting: Certified Registered Nurse Anesthetist

## 2018-04-19 ENCOUNTER — Encounter (HOSPITAL_COMMUNITY): Payer: Self-pay | Admitting: *Deleted

## 2018-04-19 ENCOUNTER — Encounter (HOSPITAL_COMMUNITY)
Admission: RE | Disposition: A | Discharge status: Home / Self Care | Payer: Self-pay | Source: Ambulatory Visit | Attending: Surgery

## 2018-04-19 ENCOUNTER — Ambulatory Visit (HOSPITAL_COMMUNITY): Payer: Medicare Other | Admitting: Emergency Medicine

## 2018-04-19 DIAGNOSIS — R6250 Unspecified lack of expected normal physiological development in childhood: Secondary | ICD-10-CM | POA: Diagnosis not present

## 2018-04-19 DIAGNOSIS — M1990 Unspecified osteoarthritis, unspecified site: Secondary | ICD-10-CM | POA: Diagnosis not present

## 2018-04-19 DIAGNOSIS — N184 Chronic kidney disease, stage 4 (severe): Secondary | ICD-10-CM | POA: Diagnosis not present

## 2018-04-19 DIAGNOSIS — Z8249 Family history of ischemic heart disease and other diseases of the circulatory system: Secondary | ICD-10-CM | POA: Diagnosis not present

## 2018-04-19 DIAGNOSIS — E1122 Type 2 diabetes mellitus with diabetic chronic kidney disease: Secondary | ICD-10-CM | POA: Insufficient documentation

## 2018-04-19 DIAGNOSIS — I129 Hypertensive chronic kidney disease with stage 1 through stage 4 chronic kidney disease, or unspecified chronic kidney disease: Secondary | ICD-10-CM | POA: Insufficient documentation

## 2018-04-19 DIAGNOSIS — D631 Anemia in chronic kidney disease: Secondary | ICD-10-CM | POA: Insufficient documentation

## 2018-04-19 DIAGNOSIS — E669 Obesity, unspecified: Secondary | ICD-10-CM | POA: Diagnosis not present

## 2018-04-19 DIAGNOSIS — K3184 Gastroparesis: Secondary | ICD-10-CM | POA: Insufficient documentation

## 2018-04-19 DIAGNOSIS — Z79899 Other long term (current) drug therapy: Secondary | ICD-10-CM | POA: Insufficient documentation

## 2018-04-19 DIAGNOSIS — K219 Gastro-esophageal reflux disease without esophagitis: Secondary | ICD-10-CM | POA: Diagnosis not present

## 2018-04-19 DIAGNOSIS — R011 Cardiac murmur, unspecified: Secondary | ICD-10-CM | POA: Insufficient documentation

## 2018-04-19 DIAGNOSIS — E1143 Type 2 diabetes mellitus with diabetic autonomic (poly)neuropathy: Secondary | ICD-10-CM | POA: Insufficient documentation

## 2018-04-19 DIAGNOSIS — Z6831 Body mass index (BMI) 31.0-31.9, adult: Secondary | ICD-10-CM | POA: Diagnosis not present

## 2018-04-19 DIAGNOSIS — N186 End stage renal disease: Secondary | ICD-10-CM | POA: Diagnosis present

## 2018-04-19 HISTORY — PX: CAPD INSERTION: SHX5233

## 2018-04-19 LAB — GLUCOSE, CAPILLARY
GLUCOSE-CAPILLARY: 126 mg/dL — AB (ref 70–99)
Glucose-Capillary: 80 mg/dL (ref 70–99)
Glucose-Capillary: 89 mg/dL (ref 70–99)

## 2018-04-19 LAB — POCT I-STAT 4, (NA,K, GLUC, HGB,HCT)
Glucose, Bld: 88 mg/dL (ref 70–99)
HCT: 22 % — ABNORMAL LOW (ref 36.0–46.0)
Hemoglobin: 7.5 g/dL — ABNORMAL LOW (ref 12.0–15.0)
POTASSIUM: 3.8 mmol/L (ref 3.5–5.1)
SODIUM: 141 mmol/L (ref 135–145)

## 2018-04-19 LAB — HCG, SERUM, QUALITATIVE: PREG SERUM: NEGATIVE

## 2018-04-19 SURGERY — LAPAROSCOPIC INSERTION CONTINUOUS AMBULATORY PERITONEAL DIALYSIS  (CAPD) CATHETER
Anesthesia: General | Site: Abdomen

## 2018-04-19 MED ORDER — OXYCODONE HCL 5 MG/5ML PO SOLN: 5.0000 mg | Freq: Once | ORAL | Status: DC | PRN

## 2018-04-19 MED ORDER — 0.9 % SODIUM CHLORIDE (POUR BTL) OPTIME
TOPICAL | Status: DC | PRN
  Administered 2018-04-19: 1000 mL

## 2018-04-19 MED ORDER — SODIUM CHLORIDE 0.9% FLUSH: 3.0000 mL | Freq: Two times a day (BID) | INTRAVENOUS | Status: DC

## 2018-04-19 MED ORDER — LIDOCAINE HCL (CARDIAC) PF 100 MG/5ML IV SOSY
PREFILLED_SYRINGE | INTRAVENOUS | Status: DC | PRN
  Administered 2018-04-19: 100 mg via INTRAVENOUS

## 2018-04-19 MED ORDER — SODIUM CHLORIDE 0.9 % IV SOLN
INTRAVENOUS | Status: AC
  Filled 2018-04-19: qty 1.2

## 2018-04-19 MED ORDER — SODIUM CHLORIDE 0.9 % IV SOLN: 250.0000 mL | INTRAVENOUS | Status: DC | PRN

## 2018-04-19 MED ORDER — FENTANYL CITRATE (PF) 250 MCG/5ML IJ SOLN
INTRAMUSCULAR | Status: AC
  Filled 2018-04-19: qty 5

## 2018-04-19 MED ORDER — DEXAMETHASONE SODIUM PHOSPHATE 10 MG/ML IJ SOLN
INTRAMUSCULAR | Status: AC
  Filled 2018-04-19: qty 1

## 2018-04-19 MED ORDER — PROPOFOL 10 MG/ML IV BOLUS
INTRAVENOUS | Status: AC
  Filled 2018-04-19: qty 20

## 2018-04-19 MED ORDER — ACETAMINOPHEN 650 MG RE SUPP: 650.0000 mg | RECTAL | Status: DC | PRN

## 2018-04-19 MED ORDER — BUPIVACAINE-EPINEPHRINE 0.25% -1:200000 IJ SOLN
INTRAMUSCULAR | Status: DC | PRN
  Administered 2018-04-19: 16 mL

## 2018-04-19 MED ORDER — PROMETHAZINE HCL 25 MG/ML IJ SOLN: 6.2500 mg | INTRAMUSCULAR | Status: DC | PRN

## 2018-04-19 MED ORDER — PHENYLEPHRINE 40 MCG/ML (10ML) SYRINGE FOR IV PUSH (FOR BLOOD PRESSURE SUPPORT)
PREFILLED_SYRINGE | INTRAVENOUS | Status: DC | PRN
  Administered 2018-04-19: 120 ug via INTRAVENOUS
  Administered 2018-04-19: 80 ug via INTRAVENOUS

## 2018-04-19 MED ORDER — ROCURONIUM BROMIDE 10 MG/ML (PF) SYRINGE
PREFILLED_SYRINGE | INTRAVENOUS | Status: DC | PRN
  Administered 2018-04-19: 60 mg via INTRAVENOUS

## 2018-04-19 MED ORDER — HYDROCODONE-ACETAMINOPHEN 5-325 MG PO TABS: 1.0000 | ORAL_TABLET | Freq: Four times a day (QID) | ORAL | 0 refills | Status: AC | PRN

## 2018-04-19 MED ORDER — CELECOXIB 200 MG PO CAPS
200.0000 mg | ORAL_CAPSULE | ORAL | Status: AC
  Administered 2018-04-19: 200 mg via ORAL
  Filled 2018-04-19: qty 1

## 2018-04-19 MED ORDER — MIDAZOLAM HCL 2 MG/2ML IJ SOLN
INTRAMUSCULAR | Status: AC
  Filled 2018-04-19: qty 2

## 2018-04-19 MED ORDER — LACTATED RINGERS IV SOLN: INTRAVENOUS | Status: DC | PRN

## 2018-04-19 MED ORDER — STERILE WATER FOR IRRIGATION IR SOLN
Status: DC | PRN
  Administered 2018-04-19: 1000 mL

## 2018-04-19 MED ORDER — ONDANSETRON HCL 4 MG/2ML IJ SOLN
INTRAMUSCULAR | Status: DC | PRN
  Administered 2018-04-19: 4 mg via INTRAVENOUS

## 2018-04-19 MED ORDER — SODIUM CHLORIDE 0.9 % IR SOLN
Status: DC | PRN
  Administered 2018-04-19: 1000 mL

## 2018-04-19 MED ORDER — OXYCODONE HCL 5 MG PO TABS: 5.0000 mg | ORAL_TABLET | Freq: Once | ORAL | Status: DC | PRN

## 2018-04-19 MED ORDER — CHLORHEXIDINE GLUCONATE 4 % EX LIQD: 60.0000 mL | Freq: Once | CUTANEOUS | Status: DC

## 2018-04-19 MED ORDER — FENTANYL CITRATE (PF) 100 MCG/2ML IJ SOLN
INTRAMUSCULAR | Status: DC | PRN
  Administered 2018-04-19: 50 ug via INTRAVENOUS
  Administered 2018-04-19: 100 ug via INTRAVENOUS

## 2018-04-19 MED ORDER — PHENYLEPHRINE 40 MCG/ML (10ML) SYRINGE FOR IV PUSH (FOR BLOOD PRESSURE SUPPORT)
PREFILLED_SYRINGE | INTRAVENOUS | Status: AC
  Filled 2018-04-19: qty 30

## 2018-04-19 MED ORDER — ONDANSETRON HCL 4 MG/2ML IJ SOLN
INTRAMUSCULAR | Status: AC
  Filled 2018-04-19: qty 2

## 2018-04-19 MED ORDER — SUGAMMADEX SODIUM 500 MG/5ML IV SOLN
INTRAVENOUS | Status: AC
  Filled 2018-04-19: qty 5

## 2018-04-19 MED ORDER — SUGAMMADEX SODIUM 200 MG/2ML IV SOLN
INTRAVENOUS | Status: DC | PRN
  Administered 2018-04-19: 290.4 mg via INTRAVENOUS

## 2018-04-19 MED ORDER — DEXAMETHASONE SODIUM PHOSPHATE 10 MG/ML IJ SOLN
INTRAMUSCULAR | Status: DC | PRN
  Administered 2018-04-19: 8 mg via INTRAVENOUS

## 2018-04-19 MED ORDER — SODIUM CHLORIDE 0.9% FLUSH: 3.0000 mL | INTRAVENOUS | Status: DC | PRN

## 2018-04-19 MED ORDER — SODIUM CHLORIDE 0.9 % IV SOLN
INTRAVENOUS | Status: DC
  Administered 2018-04-19 (×2): via INTRAVENOUS

## 2018-04-19 MED ORDER — ACETAMINOPHEN 500 MG PO TABS
1000.0000 mg | ORAL_TABLET | ORAL | Status: AC
  Administered 2018-04-19: 1000 mg via ORAL
  Filled 2018-04-19: qty 2

## 2018-04-19 MED ORDER — HYDROMORPHONE HCL 1 MG/ML IJ SOLN: 0.2500 mg | INTRAMUSCULAR | Status: DC | PRN

## 2018-04-19 MED ORDER — SODIUM CHLORIDE 0.9 % IV SOLN
INTRAVENOUS | Status: DC | PRN
  Administered 2018-04-19: 10:00:00

## 2018-04-19 MED ORDER — GABAPENTIN 300 MG PO CAPS
300.0000 mg | ORAL_CAPSULE | ORAL | Status: AC
  Administered 2018-04-19: 300 mg via ORAL
  Filled 2018-04-19: qty 1

## 2018-04-19 MED ORDER — BUPIVACAINE-EPINEPHRINE (PF) 0.25% -1:200000 IJ SOLN
INTRAMUSCULAR | Status: AC
  Filled 2018-04-19: qty 30

## 2018-04-19 MED ORDER — ACETAMINOPHEN 325 MG PO TABS: 650.0000 mg | ORAL_TABLET | ORAL | Status: DC | PRN

## 2018-04-19 MED ORDER — PROPOFOL 10 MG/ML IV BOLUS
INTRAVENOUS | Status: DC | PRN
  Administered 2018-04-19: 170 mg via INTRAVENOUS

## 2018-04-19 MED ORDER — EPHEDRINE 5 MG/ML INJ
INTRAVENOUS | Status: AC
  Filled 2018-04-19: qty 10

## 2018-04-19 MED ORDER — MIDAZOLAM HCL 5 MG/5ML IJ SOLN
INTRAMUSCULAR | Status: DC | PRN
  Administered 2018-04-19: 2 mg via INTRAVENOUS

## 2018-04-19 MED ORDER — SODIUM CHLORIDE 0.9 % IV SOLN
INTRAVENOUS | Status: DC | PRN
  Administered 2018-04-19: 40 ug/min via INTRAVENOUS

## 2018-04-19 MED ORDER — CEFAZOLIN SODIUM-DEXTROSE 2-4 GM/100ML-% IV SOLN
2.0000 g | INTRAVENOUS | Status: AC
  Administered 2018-04-19: 2 g via INTRAVENOUS
  Filled 2018-04-19: qty 100

## 2018-04-19 MED ORDER — OXYCODONE HCL 5 MG PO TABS: 5.0000 mg | ORAL_TABLET | ORAL | Status: DC | PRN

## 2018-04-19 MED ORDER — SUCCINYLCHOLINE CHLORIDE 200 MG/10ML IV SOSY
PREFILLED_SYRINGE | INTRAVENOUS | Status: AC
  Filled 2018-04-19: qty 10

## 2018-04-19 MED ORDER — DOCUSATE SODIUM 100 MG PO CAPS: 100.0000 mg | ORAL_CAPSULE | Freq: Two times a day (BID) | ORAL | 0 refills | Status: AC

## 2018-04-19 MED ORDER — FENTANYL CITRATE (PF) 100 MCG/2ML IJ SOLN: 25.0000 ug | INTRAMUSCULAR | Status: DC | PRN

## 2018-04-19 SURGICAL SUPPLY — 39 items
ADAPTER TITANIUM MEDIONICS (MISCELLANEOUS) ×3 IMPLANT
BAG DECANTER FOR FLEXI CONT (MISCELLANEOUS) ×3 IMPLANT
BLADE CLIPPER SURG (BLADE) IMPLANT
CANISTER SUCT 3000ML PPV (MISCELLANEOUS) IMPLANT
CATH EXTENDED DIALYSIS (CATHETERS) ×3 IMPLANT
CHLORAPREP W/TINT 26ML (MISCELLANEOUS) ×3 IMPLANT
COVER SURGICAL LIGHT HANDLE (MISCELLANEOUS) ×3 IMPLANT
DECANTER SPIKE VIAL GLASS SM (MISCELLANEOUS) ×3 IMPLANT
DERMABOND ADVANCED (GAUZE/BANDAGES/DRESSINGS) ×2
DERMABOND ADVANCED .7 DNX12 (GAUZE/BANDAGES/DRESSINGS) ×1 IMPLANT
DEVICE PMI PUNCTURE CLOSURE (MISCELLANEOUS) ×3 IMPLANT
DRSG TEGADERM 4X4.75 (GAUZE/BANDAGES/DRESSINGS) ×9 IMPLANT
ELECT REM PT RETURN 9FT ADLT (ELECTROSURGICAL) ×3
ELECTRODE REM PT RTRN 9FT ADLT (ELECTROSURGICAL) ×1 IMPLANT
GAUZE SPONGE 4X4 12PLY STRL (GAUZE/BANDAGES/DRESSINGS) ×3 IMPLANT
GLOVE BIO SURGEON STRL SZ 6 (GLOVE) ×3 IMPLANT
GLOVE INDICATOR 6.5 STRL GRN (GLOVE) ×3 IMPLANT
GOWN STRL REUS W/ TWL LRG LVL3 (GOWN DISPOSABLE) ×3 IMPLANT
GOWN STRL REUS W/TWL LRG LVL3 (GOWN DISPOSABLE) ×6
KIT BASIN OR (CUSTOM PROCEDURE TRAY) ×3 IMPLANT
KIT TURNOVER KIT B (KITS) ×3 IMPLANT
NS IRRIG 1000ML POUR BTL (IV SOLUTION) ×3 IMPLANT
PAD ARMBOARD 7.5X6 YLW CONV (MISCELLANEOUS) ×3 IMPLANT
SCISSORS LAP 5X35 DISP (ENDOMECHANICALS) ×3 IMPLANT
SET EXT 12IN DIALYSIS STAY-SAF (MISCELLANEOUS) ×3 IMPLANT
SET IRRIG TUBING LAPAROSCOPIC (IRRIGATION / IRRIGATOR) IMPLANT
SLEEVE ENDOPATH XCEL 5M (ENDOMECHANICALS) ×6 IMPLANT
STYLET FALLER (MISCELLANEOUS) ×3 IMPLANT
STYLET FALLER MEDIONICS (MISCELLANEOUS) ×3 IMPLANT
SUT MNCRL AB 4-0 PS2 18 (SUTURE) ×3 IMPLANT
SUT SILK 0 TIES 10X30 (SUTURE) ×3 IMPLANT
TOWEL OR 17X24 6PK STRL BLUE (TOWEL DISPOSABLE) ×3 IMPLANT
TOWEL OR 17X26 10 PK STRL BLUE (TOWEL DISPOSABLE) ×3 IMPLANT
TRAY LAPAROSCOPIC MC (CUSTOM PROCEDURE TRAY) ×3 IMPLANT
TROCAR 5MMX150MM (TROCAR) ×3 IMPLANT
TROCAR XCEL NON-BLD 5MMX100MML (ENDOMECHANICALS) ×3 IMPLANT
TUBING CYSTO DISP (UROLOGICAL SUPPLIES) ×3 IMPLANT
TUBING INSUFFLATION (TUBING) ×3 IMPLANT
WATER STERILE IRR 1000ML POUR (IV SOLUTION) ×3 IMPLANT

## 2018-04-19 NOTE — Progress Notes (Signed)
Report given to michelle rn as caregiver 

## 2018-04-19 NOTE — Transfer of Care (Signed)
Immediate Anesthesia Transfer of Care Note  Patient: Pamela Goodwin  Procedure(s) Performed: LAPAROSCOPIC INSERTION CONTINUOUS AMBULATORY PERITONEAL DIALYSIS  (CAPD) CATHETER (N/A Abdomen)  Patient Location: PACU  Anesthesia Type:General  Level of Consciousness: awake and patient cooperative  Airway & Oxygen Therapy: Patient Spontanous Breathing and Patient connected to nasal cannula oxygen  Post-op Assessment: Report given to RN, Post -op Vital signs reviewed and stable and Patient moving all extremities X 4  Post vital signs: Reviewed and stable  Last Vitals:  Vitals Value Taken Time  BP 123/82 04/19/2018 11:59 AM  Temp    Pulse 80 04/19/2018 12:01 PM  Resp 16 04/19/2018 12:01 PM  SpO2 91 % 04/19/2018 12:01 PM  Vitals shown include unvalidated device data.  Last Pain:  Vitals:   04/19/18 0856  TempSrc:   PainSc: 0-No pain      Patients Stated Pain Goal: 3 (04/19/18 0856)  Complications: No apparent anesthesia complications

## 2018-04-19 NOTE — Anesthesia Postprocedure Evaluation (Signed)
Anesthesia Post Note  Patient: Pamela Goodwin  Procedure(s) Performed: LAPAROSCOPIC INSERTION CONTINUOUS AMBULATORY PERITONEAL DIALYSIS  (CAPD) CATHETER (N/A Abdomen)     Patient location during evaluation: PACU Anesthesia Type: General Level of consciousness: awake and alert Pain management: pain level controlled Vital Signs Assessment: post-procedure vital signs reviewed and stable Respiratory status: spontaneous breathing, nonlabored ventilation and respiratory function stable Cardiovascular status: blood pressure returned to baseline and stable Postop Assessment: no apparent nausea or vomiting Anesthetic complications: no    Last Vitals:  Vitals:   04/19/18 1245 04/19/18 1300  BP: 123/85 125/86  Pulse: 78 76  Resp: 16 (!) 5  Temp:    SpO2: 100% 100%    Last Pain:  Vitals:   04/19/18 1330  TempSrc:   PainSc: 0-No pain                 Lowella CurbWarren Ray Miller

## 2018-04-19 NOTE — Anesthesia Preprocedure Evaluation (Addendum)
Anesthesia Evaluation  Patient identified by MRN, date of birth, ID band Patient awake    Reviewed: Allergy & Precautions, NPO status , Patient's Chart, lab work & pertinent test results, reviewed documented beta blocker date and time   Airway Mallampati: II  TM Distance: >3 FB Neck ROM: Full    Dental no notable dental hx. (+) Teeth Intact, Dental Advisory Given   Pulmonary neg pulmonary ROS,    Pulmonary exam normal breath sounds clear to auscultation       Cardiovascular hypertension, Pt. on medications and Pt. on home beta blockers negative cardio ROS Normal cardiovascular exam Rhythm:Regular Rate:Normal     Neuro/Psych negative neurological ROS  negative psych ROS   GI/Hepatic negative GI ROS, Neg liver ROS, GERD  Controlled,  Endo/Other  negative endocrine ROSdiabetes, Type 2  Renal/GU CRFRenal diseasenegative Renal ROS  negative genitourinary   Musculoskeletal negative musculoskeletal ROS (+)   Abdominal (+) + obese,   Peds negative pediatric ROS (+)  Hematology negative hematology ROS (+) anemia ,   Anesthesia Other Findings   Reproductive/Obstetrics negative OB ROS                            Anesthesia Physical Anesthesia Plan  ASA: III  Anesthesia Plan: General   Post-op Pain Management:    Induction: Intravenous  PONV Risk Score and Plan: 3 and Ondansetron, Dexamethasone and Midazolam  Airway Management Planned: Oral ETT  Additional Equipment:   Intra-op Plan:   Post-operative Plan: Extubation in OR  Informed Consent: I have reviewed the patients History and Physical, chart, labs and discussed the procedure including the risks, benefits and alternatives for the proposed anesthesia with the patient or authorized representative who has indicated his/her understanding and acceptance.   Dental advisory given  Plan Discussed with: CRNA, Anesthesiologist and  Surgeon  Anesthesia Plan Comments:        Anesthesia Quick Evaluation

## 2018-04-19 NOTE — Op Note (Signed)
Operative Note  Pamela Goodwin [id number]  04/19/2018   Surgeon: Berna Buehelsea A Saidah Kempton MD   Assistant: none   Procedure performed: laparoscopic insertion of tunneled peritoneal dialysis catheter, laparoscopic omentopexy   Preop diagnosis: ESRD Post-op diagnosis/intraop findings: same.   Specimens: none Retained items: CAPD catheter  EBL: minimal cc Complications: none   Description of procedure: After obtaining informed consent the patient was taken to the operating room and placed supine on operating room table where general endotracheal anesthesia was initiated, preoperative antibiotics were administered, SCDs applied, and a formal timeout was performed. The abdomen was prepped and draped in the usual sterile fashion. Peritoneal access was gained using an Optiview technique in the left upper quadrant. Insufflation to 15 mmHg ensued without incident and gross inspection revealed no evidence of injury from entry or other intraabdominal abnormalities. Under direct visualization and after infiltration with local, an additional 5 mm trocar was placed in the left hemiabdomen. A small stab incision was made in the right upper quadrant and the laparoscopic suture passer was used with 0 Silk to pexy the omentum to the right upper quadrant abdominal wall. The pelvis was inspected. No other adhesions or other abnormalities are present. At this juncture an incision was made just to the left of the umbilicus and the long 5 mm trocar was used and inserted at a 45 angle, tunneling the preperitoneal space a fair distance before entering the abdominal cavity. The abdominal portion of the valve catheter was inserted over a stylette with the radiopaque stripe directed posteriorly. The cuff was situated within the rectus muscle. The coil sits just at and below the pubic symphysis. The stylet and trocar were then removed. We then brought the subcutaneous portion of the catheter onto the field and trimmed to suit the  abdominal wall. A subcostal incision was made in the epigastrium. The subcutaneous and intra-abdominal component of the catheter were connected to each other using the titanium adapter and then secured with 2-0 silk ties. The radioopaque stripes were aligned and oriented posteriorly. Using the tunneler, the catheter was then tunneled up through the subcutaneous tissues of theabdominal wall to exit in the subcostal incision and then brought back down through the subcutaneous tissues to exit in the left upper hemiabdomen. The external titanium adapter was then applied. Intra-abdominal inspection was repeated and the catheter was noted to sit in the pelvis without any kinking or tension. Hemostasis was confirmed. The cuff was confirmed to be in the rectus muscle in the abdominal wall with no exposure to the peritoneal cavity. At this juncture the abdomen was desufflated. Cystoscopy tubing was connected to the catheter and the abdominal cavity was instilled with a liter of sterile saline. This went in easily. The bag was then placed to gravity and we drained out about 800mL of clear fluid. The cystoscopy tubing was then disconnected and the external tubing connected. All skin incisions were closed with subcuticular Monocryl and Dermabond. No sutures were placed around the catheter itself. The exit site and catheter were covered with a bulky gauze dressing and tegaderm. The patient was then awakened, extubated and taken to PACU in stable condition.    All counts were correct at the completion of the case.

## 2018-04-19 NOTE — Discharge Instructions (Signed)
PERITONEAL DIALYSIS (CAPD) CATHETER PLACEMENT: ° °POST OPERATIVE INSTRUCTIONS ° °FOLLOW UP with the Peritoneal Dialysis Nurses ° [address] [place], [place] [address] ° °Call [phone] or your neprologist to help arrange training/flushes of your CAPD catheter °  °The CAPD nurses & Nephrology usually follow you closely, making the need for follow-up in the [facility] surgery office redundant and therefore not always needed.  If they or you have concerns, please call us for possible follow-up in our office ° °1. Do NOT shower until dialysis nursing staff have advised.  Do NOT submerge in a bathtub or hot tub. °2. Your Home Therapy RN will advise and educate you on showering, bathing, and swimming when you are in training. °3. The Peritoneal Dialysis nurse will remove your waterproof bandages in the Dialysis Center a few days after surgery.  Do not remove the bandages until seen by them.  If you dressing becomes wet, saturated, or falls off call your Home Therapy RN. °4. ACTIVITIES as tolerated:   °a. You may resume regular (light) daily activities beginning the next day--such as daily self-care, walking, climbing stairs--gradually increasing activities as tolerated.  If you can walk 30 minutes without difficulty, it is safe to try more intense activity such as jogging, treadmill, bicycling, low-impact aerobics,  etc. °b. No swimming within the 1st month of catheter placement.  You must have a Dr's order. °c. Save the most intensive and strenuous activity for last such as sit-ups, heavy lifting, contact sports, etc  Refrain from any heavy lifting or straining until you are off narcotics for pain control.   °d. DO NOT PUSH THROUGH PAIN.  Let pain be your guide: If it hurts to do something, don't do it.  Pain is your body warning you to avoid that activity for another week until the pain goes down. °e. You may drive when you are no longer taking prescription pain medication, you can comfortably wear a seatbelt, and you can  safely maneuver your car and apply brakes. °f. You may have sexual intercourse when it is comfortable.  °g. Be sure your catheter is taped to Your abdomen nor injured. Did not allow catheter to ankle, or tension on the catheter-it may cause damage to skin over the catheter °h. You will be instructed on what to do an emergency, and will have access to on call our in 24 hours a day. The number to reach the home therapy nurse as listed below. °5. DIET: Follow a light bland diet the first 24 hours after arrival home, such as soup, liquids, crackers, etc.  Be sure to include lots of fluids daily.  Avoid fast food or heavy meals as your are more likely to get nauseated.   °6. Take your usually prescribed home medications unless otherwise directed. °7. PAIN CONTROL: °a. Pain is best controlled by a usual combination of three different methods TOGETHER: °i. Ice/Heat °ii. Tylenol (over the counter pain medication) °iii. Prescription pain medication °b. Most patients will experience some swelling and bruising around the incisions.  Ice packs or heating pads (30-60 minutes up to 6 times a day) will help. Use ice for the first few days to help decrease swelling and bruising, then switch to heat to help relax tight/sore spots and speed recovery.  Some people prefer to use ice alone, heat alone, alternating between ice & heat.  Experiment to what works for you.  Swelling and bruising can take several weeks to resolve.   °c. It is helpful to take   an over-the-counter pain medication regularly for the first few weeks.  Using acetaminophen (Tylenol, etc) 500-650mg four times a day (every meal & bedtime) is usually safest since NSAIDs are not advisable in patients with kidney disease. °d. A  prescription for pain medication (such as oxycodone, hydrocodone, etc) should be given to you upon discharge.  Take your pain medication as prescribed.  °i. If you are having problems/concerns with the prescription medicine (does not control pain,  nausea, vomiting, rash, itching, etc), please call us [phone] to see if we need to switch you to a different pain medicine that will work better for you and/or control your side effect better. °ii. If you need a refill on your pain medication, please contact your pharmacy.  They will contact our office to request authorization. Prescriptions will not be filled after 5 pm or on week-ends. °8. Avoid getting constipated.  Between the surgery and the pain medications, it is common to experience some constipation.  Increasing fluid intake and taking a fiber supplement (such as Metamucil, Citrucel, FiberCon, MiraLax, etc) 1-2 times a day regularly will usually help prevent this problem from occurring.  A mild laxative (prune juice, Milk of Magnesia, MiraLax, etc) should be taken according to package directions if there are no bowel movements after 48 hours.  ° ° ° ° ° °FOLLOW UP: °Peritoneal Dialysis nurses ° [address] [place], [place] [address] ° °Call [phone] or your neprologist to help arrange training/flushes of your CAPD catheter °  ° -The CAPD nurses & Nephrology usually follow you closely, making the need for follow-up in our office redundant and therefore not needed.  If they or you have concerns, please call us for possible follow-up in our office ° ° -Please call [facility] at [phone] only as needed. ° °WHEN TO CALL US [phone]: °1. Poor pain control °2. Reactions / problems with new medications (rash/itching, nausea, etc)  °3. Fever over 101.5 F (38.5 C) °4. Worsening swelling or bruising °5. Continued bleeding from incision. °6. Increased pain, redness, or drainage from the incision ° ° The clinic staff is available to answer your questions during regular business hours (8:30am-5pm).  Please don’t hesitate to call and ask to speak to one of our nurses for clinical concerns.  ° If you have a medical emergency, go to the nearest emergency room or call 911. ° A surgeon from [facility] is always on call at the hospitals ° °9. IF YOU HAVE DISABILITY OR FAMILY LEAVE FORMS, BRING THEM TO THE OFFICE FOR PROCESSING.  DO NOT GIVE THEM TO YOUR DOCTOR. ° °[facility] °[address], [address], [place], [place]  [address] ? °MAIN: [phone] ? TOLL FREE: [phone] ?  °FAX [phone] °[email or web address] ° °Peritoneal Dialysis - An Overview °Dialysis can be done using a machine outside of the body (hemodialysis). Or, it can be done inside the body (peritoneal dialysis). The word "peritoneal" refers to the lining or membrane of the belly (abdominal cavity). The peritoneal membrane is a thin, plastic-like lining inside the belly that covers the organs and fits in the abdominal or peritoneal cavity, such as the stomach, liver and the kidneys. This lining works like a filter. It will allow certain things to pass from your blood through the lining and into a special solution that has been placed into your belly. In this type of dialysis, the peritoneum is used to help clean the blood.  °If you need dialysis, your   kidneys are not working right. Healthy kidneys take out extra water and waste products, which becomes urine. When the kidneys do not do this, serious problems can develop. The waste and water build up in the blood. Your hands and feet might swell. You may feel tired, weak or sick to your stomach. Also, your blood pressure may rise. If not treated, you could die. Dialysis is a treatment that does the work that your kidneys would do if they were healthy. °· It cleans your blood.  °· It will make sure your body has the right amount of certain chemicals that it needs. They include potassium, sodium and bicarbonate.  °· It will help control your blood pressure.  °UNDERSTANDING PERITONEAL DIALYSIS °· Here is how peritoneal dialysis works:  °· First, you will have surgery to put a soft plastic tube (catheter) into your belly (abdomen). This will allow you  to easily connect yourself to special tubing, which will then let a special dialysis solution to be placed into your abdomen.  °· For each treatment, you will need at least one bag of dialysis solution (a liquid called dialysate). It is a mix of water that is pure and free of germs (sterile), sugar (dextrose) and the nutrients and minerals found in your blood. Sometimes, more than one bag is needed to get the right amount of fluid for your abdomen. Your caregiver will explain what size and how many bags you will need.  °· The dialysate is slowly put through the catheter to fill the abdomen (called the peritoneal cavity). This dialysate will need to stay in your body for 3-4 hours. This is known as the dwell time.  °· The solution is working to clean the blood and remove wastes from your body. At the end of this time, the solution is drained from your body through tubing into an empty bag. It is then replaced with a fresh dialysate.  °· The draining and replacing of the dialysate is called an exchange or cycle. The catheter is capped after each exchange. Once the solution is in your body, you are then free to do whatever you would like until the next exchange. Most people will need to do 4-5 exchanges each day.  °· There are two different methods that can be used.  °· Continuous ambulatory peritoneal dialysis (CAPD): You put the solution into your abdomen, cap your catheter and then go about your day. Several hours later, you reconnect to a tubing set up, drain out the solution and then put more solution in. This is done several times a day. No machine is needed.  °· Continuous cycler-assisted peritoneal dialysis (CCPD): A machine is used, which fills the abdomen with dialysate and then drains it. This happens several times. It usually is done at night while you are sleeping. When you wake up, you can disconnect from the machine and are free to go to go about your day.  °PREPARING FOR EXCHANGES °· Discuss the details  of the procedure with your caregivers. You will be working with a nurse who is specially trained in doing dialysis. Make sure you understand:  °· How to do an exchange.  °· How much solution you need.  °· What type of solution you will need.  °· How often you should do an exchange. Ask:  °· How many times each day?  °· When? At meals? At bedtime?  °· Always keep the dialysate bags and other supplies in a cool, clean and dry place.  °·   Keeping everything clean is very important.  °· The catheter and its cap must be free from germs (sterile)  °· The adapter also must be sterile. It attaches the dialysis bag and tubing to the catheter.  °· Clean the area of your body around the catheter every day. Use a chemical that fights infection (antiseptic).  °· Wash your hands thoroughly before starting an exchange.  °· You may be taught to wear a mask to cover your nose and mouth. This makes infection less likely to happen.  °· You may be taught to close doors, windows and turn off any fans before doing an exchange.  °· Check the dialysate bag very carefully.  °· Make sure it is the right size bag for you. This information is on the label.  °· Also, make sure it is the right mixture. For some people, the dialysate contents vary. For instance, the mixture might be a stronger solution for overnight.  °· Check the expiration date (the last date you can use the bag). It also is on the label. If the date has gone by, throw away the bag.  °· The solution should be clear. You should be able to see any writing on the side of the bag clearly through the solution. Do not use a cloudy solution.  °· Gently squeeze the bag to make sure there are no leaks.  °· Use a dry heating pad to warm the dialysate in the bag. Leave the cover on the bag while you do this.  °· This is for comfort. You can skip this step if you want.  °· Never place the bag of solution under warm or hot water. Water from a faucet is not sterile and could cause germs to  get into the bag. Infection could then result.  °PERFORMING AN EXCHANGE °· For continuous ambulatory dialysis:  °· Attach the dialysis bag and tubing to your catheter. Hang the bag so that gravity (the natural downward pull) draws the solution down and into your abdomen once the clamps are opened. This should take about 10 minutes.  °· Remove the bag and tubing from the catheter. Cap the catheter.  °· The solution stays in the abdomen for 3-4 hours (dwell time). The solution is working to clean the blood and remove wastes from your body.  °· When you are ready to drain the solution for another exchange, take the cap off the catheter. Then, attach the catheter to tubing, which is attached to an empty bag. Place this empty bag below the abdomen or on the floor or stool and undo the clamps.  °· Gravity helps pull the fluid out of the abdomen and into the bag. The fluid in the bag may look yellow and clear, like urine. It usually takes about 20 minutes to drain the fluid out of the abdomen.  °· When the solution has drained, start the process again by infusing a new bag of dialysate and then capping the catheter.  °· This should continue until you have used all of the solution that you are to use each day.  °· Sometimes, a small machine is used overnight. It is called a mini-cycler. This is done if the body cannot go all night without an exchange. The machine lets you sleep without having to get up and do an exchange.  °· For continuous cycler-assisted dialysis:  °· You will be taught how to set up or program your machine.  °· When you are ready for bed,   put the dialysate bags onto the cycler machine. Put on exactly the number of bags that your caregiver said to use.  °· Connect your catheter to the machine and turn the cycler machine on.  °· Overnight, the cycler will do several exchanges. It often does three to five, sometimes more.  °· Solution that is in your abdomen in the morning will stay during the day. The  machine is set to make the daytime solution stronger, if that is needed.  °· In the morning, you will disconnect from the machine and cap your catheter and go about your day.  °· Sometimes, an extra exchange is done during the day. This may be needed to remove excess waste or fluid.  °IMPORTANT REMINDERS °· You will need to follow a very strict schedule. Every step of the dialysis procedure must be done every day. Sometimes, several times a day. Altogether, this might take an extra 2 hours or more. However, you must stick to the routine. Do not skip a day. Do not skip a procedure.  °· Some people find it helpful to work with a counselor or social worker in addition to the renal (kidney) nurse. They can help you figure out how to change your daily routine to fit in the dialysis sessions.  °· You may need to change your diet. Ask your caregiver for advice, or talk with a nutritionist about what you should and should not eat.  °· You will need to weigh yourself every day and keep track of what your weight is.  °· You may be taught how to check your blood pressure before every exchange. Your blood pressure reading will help determine what type of solution to use. If your blood pressure is too high, you may need a stronger solution.  °RISKS AND COMPLICATIONS  °Possible problems vary, depending on the method you use. Your overall health also can have an effect. Problems that could develop because of dialysis include: °· Infection. This is the most common problem. It could occur:  °· In the peritoneum. This is called peritonitis.  °· Around the catheter.  °· Weight gain. The dialysate contains a type of sugar known as dextrose. Dextrose has a lot of calories. The body takes in several hundred calories from this sugar each day.  °· Weakened muscles in the abdomen. This can result from all of the fluid that your body has to hold in the abdomen.  °· Catheter replacement. Sometimes, a new one has to be put in.  °· Change in  dialysis method. Due to some complications, you may need to change to hemodialysis for a short time and have your dialysis done at a center.  °· Trouble adjusting to your new lifestyle. In some people, this leads to depression.  °· Sleep problems.  °· Dialysis-related amyloidosis. This sometimes occurs after 5 years of dialysis. Protein builds up in the blood. This can cause painful deposits on bones, joints and tendons (which connect muscle to bone). Or, it can cause hollow spots in bones that make them more likely to break.  °· Excess fluid. Your body may absorb too much of the fluid that is held in the abdomen. This can lead to heart or lung problems.  °SEEK MEDICAL CARE IF:  °· You have any problems with an exchange.  °· The area around the catheter becomes red or painful.  °· The catheter seems loose, or it feels like it is coming out.  °· A bag of dialysate looks   cloudy. Or, the liquid is an unusual color.  °· Abdominal pain or discomfort.  °· You feel sick to your stomach (nauseous) or throw up (vomit).  °· You develop a fever of more than 102° F (38.9° C).  °SEEK IMMEDIATE MEDICAL CARE IF:  °You develop a fever of more than 102° F (38.9° C). °Document Released: 06/20/2009 Document Revised: 08/12/2011 Document Reviewed: 06/20/2009 °ExitCare® Patient Information ©2012 [employer]. ° °Diet for Peritoneal Dialysis °This diet may be modified in protein, sodium, phosphorus, potassium, or fluid, depending on your needs. The goals of nutrition therapy are similar to those for patients on hemodialysis. Providing enough protein to replace peritoneal losses is a priority. °USES OF THIS DIET °The diet is designed for the patient with end-stage kidney (renal) disease, who is treated by peritoneal dialysis. Treatment options include: °· Continuous Ambulatory Peritoneal Dialysis (CAPD): Usually 4 exchanges of 1.5 to 2 liter volumes of glucose (sugar) and electrolyte-containing dialysate.  °· Continuous Cyclic Peritoneal  Dialysis (CCPD): Essentially a reversal of CAPD, with shorter exchanges at night and a longer one during the day.  °· Intermittent Peritoneal Dialysis (IPD): 10 to 12 hours of exchanges, 2 to 3 times weekly.  °ADEQUACY °The diet may not meet the Recommended Dietary Allowances of the National Research Council for calcium and ascorbic acid. Protein and water-soluble vitamin needs may be increased because of losses into the dialysate. Recommended daily supplements are the same as for hemodialysis patients. °ASSESSMENT/DETERMINATION OF DIET °Dietary needs will differ between patients. Parameters must be individualized. °Protein °· Guidelines: 1.2 to 1.3 gm/kg/day OR 1.5 gm/kg/day if patient is malnourished, catabolic, or has a protracted episode of peritonitis. A minimum of 50% of the protein intake should be of high biological value.  °· Goals: Meet protein requirements and replace dialysate losses while avoiding excessive accumulation of waste products. Achieve serum albumin greater than 3.5 g/dL.  °· Evaluate: Current nutritional status, serum albumin and BUN levels, presence of peritonitis.  °Sodium °· Guidelines: Usually 90 to 175 mEq (2000 to 4000 mg), but should be individualized.  °· Goals: Minimize complications of fluid imbalance.  °· Evaluate: Weight, blood pressure regulation, and presence of swelling (edema).  °Potassium °· Guidelines: Individualized; often not restricted, and may need to be supplemented.  °· Goals: Serum K+ levels between 4.0 to 5.0 mEq/L.  °· Evaluate: Serum K+ levels, usual intake of K+, appetite.  °Phosphorus °· Guidelines: 800 to 1200 mg/day (the high protein intake results in a high obligatory P intake).  °· Goal: Serum P levels between 4.5 to 6.0 mg/dL.  °· Evaluate: Serum P levels, usual P intake, P-binding medications: type, number, dosage, distribution.  °Fluids °· Guidelines: Individualized - may not be restricted for all patients.  °· Goal: Minimize complications of fluid  imbalance.  °· Evaluate: Weight, blood pressure regulation, sodium intake, and presence of edema.  °Document Released: 08/23/2005 Document Revised: 08/12/2011 Document Reviewed: 11/15/2006 °ExitCare® Patient Information ©2012 [employer]. °

## 2018-04-19 NOTE — Anesthesia Procedure Notes (Signed)
Procedure Name: Intubation Date/Time: 04/19/2018 10:33 AM Performed by: Carney Living, CRNA Pre-anesthesia Checklist: Patient identified, Emergency Drugs available, Suction available, Patient being monitored and Timeout performed Patient Re-evaluated:Patient Re-evaluated prior to induction Oxygen Delivery Method: Circle system utilized Preoxygenation: Pre-oxygenation with 100% oxygen Induction Type: IV induction Ventilation: Oral airway inserted - appropriate to patient size and Mask ventilation without difficulty Laryngoscope Size: Mac and 3 Grade View: Grade I Tube type: Oral Tube size: 7.0 mm Number of attempts: 1 Airway Equipment and Method: Stylet Placement Confirmation: ETT inserted through vocal cords under direct vision,  positive ETCO2 and breath sounds checked- equal and bilateral Secured at: 21 cm Tube secured with: Tape Dental Injury: Teeth and Oropharynx as per pre-operative assessment

## 2018-04-19 NOTE — Progress Notes (Signed)
Patient unable to provide urine sample the morning of surgery, serum pregnancy drew and sent to main lab.  Patients Hemoglobin on istat 4 came back at 7.5, Dr. Hyacinth MeekerMiller notified, no further orders received at this time

## 2018-04-19 NOTE — H&P (Signed)
Pamela Goodwin DOB: 1971-08-28 Single / Language: [place] / Race: Black or African American Female  History of Present Illness Patient words: This is a 47 year old woman here with her father and her sister to discuss peritoneal dialysis catheter placement. Her father is her primary caretaker and Management consultantdecision maker. History of developmental delay, type 2 diabetes currently managed without medication, hypertension, CKD stage IV, diabetic gastroparesis requiring multiple hospital admissions most recently in September 2018. She has not started on dialysis. She was set to have a fistula placed by Dr. Darrick PennaFields in October but this never happened. NO prior surgeries.  I do not have any records of recent labs. Her father states that her creatinine is up to 7 something.    Past Surgical History  No pertinent past surgical history  Diagnostic Studies History Colonoscopy never Mammogram 1-3 years ago  Allergies  No Known Drug Allergies [03/17/2018]: Allergies Reconciled  Medication History  AmLODIPine Besylate (10MG  Tablet, Oral) Active. Amoxicillin-Pot Clavulanate (500-125MG  Tablet, Oral) Active. Isosorbide Dinitrate (30MG  Tablet, Oral) Active. Metoprolol Tartrate (25MG  Tablet, Oral) Active. Mirtazapine (15MG  Tablet, Oral) Active. Pantoprazole Sodium (40MG  Tablet DR, Oral) Active. Pazeo (0.7% Solution, Ophthalmic) Active. TiZANidine HCl (2MG  Tablet, Oral) Active. Medications Reconciled  Social History  Caffeine use Carbonated beverages, Coffee, Tea. No alcohol use Tobacco use Never smoker.  Family History  Heart disease in female family member before age 47 Hypertension Father.  Pregnancy / Birth History  Age at menarche 11 years.  Other Problems  Arthritis Heart murmur High blood pressure     Review of Systems General Not Present- Appetite Loss, Chills, Fatigue, Fever, Night Sweats, Weight Gain and Weight Loss. Skin Not Present- Change in  Wart/Mole, Dryness, Hives, Jaundice, New Lesions, Non-Healing Wounds, Rash and Ulcer. HEENT Not Present- Earache, Hearing Loss, Hoarseness, Nose Bleed, Oral Ulcers, Ringing in the Ears, Seasonal Allergies, Sinus Pain, Sore Throat, Visual Disturbances, Wears glasses/contact lenses and Yellow Eyes. Respiratory Not Present- Bloody sputum, Chronic Cough, Difficulty Breathing, Snoring and Wheezing. Breast Not Present- Breast Mass, Breast Pain, Nipple Discharge and Skin Changes. Cardiovascular Not Present- Chest Pain, Difficulty Breathing Lying Down, Leg Cramps, Palpitations, Rapid Heart Rate, Shortness of Breath and Swelling of Extremities. Gastrointestinal Not Present- Abdominal Pain, Bloating, Bloody Stool, Change in Bowel Habits, Chronic diarrhea, Constipation, Difficulty Swallowing, Excessive gas, Gets full quickly at meals, Hemorrhoids, Indigestion, Nausea, Rectal Pain and Vomiting. Female Genitourinary Not Present- Frequency, Nocturia, Painful Urination, Pelvic Pain and Urgency. Musculoskeletal Not Present- Back Pain, Joint Pain, Joint Stiffness, Muscle Pain, Muscle Weakness and Swelling of Extremities. Neurological Not Present- Decreased Memory, Fainting, Headaches, Numbness, Seizures, Tingling, Tremor, Trouble walking and Weakness. Psychiatric Not Present- Anxiety, Bipolar, Change in Sleep Pattern, Depression, Fearful and Frequent crying. Endocrine Not Present- Cold Intolerance, Excessive Hunger, Hair Changes, Heat Intolerance, Hot flashes and New Diabetes. Hematology Not Present- Blood Thinners, Easy Bruising, Excessive bleeding, Gland problems, HIV and Persistent Infections.  Vitals  03/17/2018 2:02 PM Weight: 161.25 lb Height: 60in Body Surface Area: 1.7 m Body Mass Index: 31.49 kg/m  Temp.: 98.43F(Oral)  Pulse: 95 (Regular)  BP: 150/92 (Sitting, Left Arm, Standard)    Physical Exam   The physical exam findings are as follows: Note:Gen: alert and cooperative Eye:  extraocular motion intact, no scleral icterus ENT: moist mucus membranes, dentition intact Neck: no mass or thyromegaly Chest: unlabored respirations, symmetrical air entry, clear bilaterally CV: regular rate and rhythm, no pedal edema Abdomen: soft, nontender, nondistended. No mass or organomegaly. obese. MSK: strength symmetrical throughout, no deformity Neuro: She does  have a developmental delay but is able to answer some questions. Grossly intact, normal gait Psych: normal mood and affect, limited insight Skin: warm and dry, no rash or lesion on limited exam    Assessment & Plan   CKD (CHRONIC KIDNEY DISEASE), STAGE IV (N18.4) Story: She has not yet started dialysis. Peritoneal dialysis would be reasonable, but I suspect that she will require temporary venous access before this can initiate. Her nephrologist would like us to go ahead and place the PD catheter. We discussed the surgery including risks of bleeding, infection, pain, scarring, intra-abdominal injury, catheter occlusion or malfunction, need for revision or removal of the catheter, and intolerance of peritoneal dialysis requiring her to ultimately go on hemodialysis. Questions were answered. She has been cleared for general anesthesia by Dr. Purvis SheffieldKoneswaran (cardiology).   DIABETES (E11.9) Story: reports she is not on any medications   HYPERTENSION (I10)   GERD (GASTROESOPHAGEAL REFLUX DISEASE) (K21.9)   GASTROPARESIS (K31.84)

## 2018-04-20 ENCOUNTER — Encounter (HOSPITAL_COMMUNITY): Payer: Self-pay | Admitting: Surgery

## 2019-03-07 DEATH — deceased
